# Patient Record
Sex: Male | Born: 1960 | Race: White | Hispanic: No | Marital: Single | Smoking: Former smoker
Health system: Southern US, Community
[De-identification: ages and names within clinical notes are randomized; demographics above are authoritative.]

## PROBLEM LIST (undated history)

## (undated) DIAGNOSIS — G709 Myoneural disorder, unspecified: Secondary | ICD-10-CM

## (undated) DIAGNOSIS — J45909 Unspecified asthma, uncomplicated: Secondary | ICD-10-CM

## (undated) DIAGNOSIS — I429 Cardiomyopathy, unspecified: Secondary | ICD-10-CM

## (undated) DIAGNOSIS — R569 Unspecified convulsions: Secondary | ICD-10-CM

## (undated) DIAGNOSIS — K219 Gastro-esophageal reflux disease without esophagitis: Secondary | ICD-10-CM

## (undated) DIAGNOSIS — G629 Polyneuropathy, unspecified: Secondary | ICD-10-CM

## (undated) HISTORY — PX: KNEE ARTHROSCOPY: SUR90

---

## 2004-02-29 ENCOUNTER — Ambulatory Visit: Payer: Self-pay | Admitting: Internal Medicine

## 2004-03-01 ENCOUNTER — Ambulatory Visit: Payer: Self-pay | Admitting: *Deleted

## 2004-03-20 ENCOUNTER — Ambulatory Visit: Payer: Self-pay | Admitting: Internal Medicine

## 2012-12-01 ENCOUNTER — Encounter (HOSPITAL_COMMUNITY): Admission: EM | Disposition: E | Payer: Self-pay | Source: Home / Self Care

## 2012-12-01 ENCOUNTER — Inpatient Hospital Stay (HOSPITAL_COMMUNITY): Payer: No Typology Code available for payment source

## 2012-12-01 ENCOUNTER — Encounter (HOSPITAL_COMMUNITY): Payer: No Typology Code available for payment source | Admitting: Anesthesiology

## 2012-12-01 ENCOUNTER — Emergency Department (HOSPITAL_COMMUNITY): Payer: No Typology Code available for payment source

## 2012-12-01 ENCOUNTER — Inpatient Hospital Stay (HOSPITAL_COMMUNITY): Payer: No Typology Code available for payment source | Admitting: Anesthesiology

## 2012-12-01 ENCOUNTER — Encounter (HOSPITAL_COMMUNITY): Payer: Self-pay | Admitting: Emergency Medicine

## 2012-12-01 ENCOUNTER — Inpatient Hospital Stay (HOSPITAL_COMMUNITY)
Admission: EM | Admit: 2012-12-01 | Discharge: 2013-01-01 | DRG: 957 | Disposition: E | Payer: No Typology Code available for payment source | Attending: General Surgery | Admitting: General Surgery

## 2012-12-01 DIAGNOSIS — G589 Mononeuropathy, unspecified: Secondary | ICD-10-CM | POA: Diagnosis present

## 2012-12-01 DIAGNOSIS — Z515 Encounter for palliative care: Secondary | ICD-10-CM

## 2012-12-01 DIAGNOSIS — S82209A Unspecified fracture of shaft of unspecified tibia, initial encounter for closed fracture: Secondary | ICD-10-CM

## 2012-12-01 DIAGNOSIS — Z87891 Personal history of nicotine dependence: Secondary | ICD-10-CM

## 2012-12-01 DIAGNOSIS — S0181XA Laceration without foreign body of other part of head, initial encounter: Secondary | ICD-10-CM

## 2012-12-01 DIAGNOSIS — A419 Sepsis, unspecified organism: Secondary | ICD-10-CM | POA: Diagnosis not present

## 2012-12-01 DIAGNOSIS — T79A29A Traumatic compartment syndrome of unspecified lower extremity, initial encounter: Secondary | ICD-10-CM | POA: Diagnosis present

## 2012-12-01 DIAGNOSIS — J45909 Unspecified asthma, uncomplicated: Secondary | ICD-10-CM | POA: Diagnosis present

## 2012-12-01 DIAGNOSIS — I493 Ventricular premature depolarization: Secondary | ICD-10-CM

## 2012-12-01 DIAGNOSIS — S14109A Unspecified injury at unspecified level of cervical spinal cord, initial encounter: Secondary | ICD-10-CM

## 2012-12-01 DIAGNOSIS — G629 Polyneuropathy, unspecified: Secondary | ICD-10-CM

## 2012-12-01 DIAGNOSIS — S82409A Unspecified fracture of shaft of unspecified fibula, initial encounter for closed fracture: Secondary | ICD-10-CM

## 2012-12-01 DIAGNOSIS — IMO0002 Reserved for concepts with insufficient information to code with codable children: Secondary | ICD-10-CM | POA: Diagnosis present

## 2012-12-01 DIAGNOSIS — S14121A Central cord syndrome at C1 level of cervical spinal cord, initial encounter: Principal | ICD-10-CM | POA: Diagnosis present

## 2012-12-01 DIAGNOSIS — D696 Thrombocytopenia, unspecified: Secondary | ICD-10-CM | POA: Diagnosis not present

## 2012-12-01 DIAGNOSIS — S82202A Unspecified fracture of shaft of left tibia, initial encounter for closed fracture: Secondary | ICD-10-CM

## 2012-12-01 DIAGNOSIS — I4949 Other premature depolarization: Secondary | ICD-10-CM | POA: Diagnosis not present

## 2012-12-01 DIAGNOSIS — I429 Cardiomyopathy, unspecified: Secondary | ICD-10-CM

## 2012-12-01 DIAGNOSIS — K920 Hematemesis: Secondary | ICD-10-CM | POA: Diagnosis not present

## 2012-12-01 DIAGNOSIS — T1490XA Injury, unspecified, initial encounter: Secondary | ICD-10-CM | POA: Diagnosis present

## 2012-12-01 DIAGNOSIS — I5181 Takotsubo syndrome: Secondary | ICD-10-CM | POA: Diagnosis not present

## 2012-12-01 DIAGNOSIS — S82142A Displaced bicondylar fracture of left tibia, initial encounter for closed fracture: Secondary | ICD-10-CM

## 2012-12-01 DIAGNOSIS — S82109A Unspecified fracture of upper end of unspecified tibia, initial encounter for closed fracture: Secondary | ICD-10-CM | POA: Diagnosis present

## 2012-12-01 DIAGNOSIS — K219 Gastro-esophageal reflux disease without esophagitis: Secondary | ICD-10-CM | POA: Diagnosis present

## 2012-12-01 DIAGNOSIS — R4182 Altered mental status, unspecified: Secondary | ICD-10-CM | POA: Diagnosis present

## 2012-12-01 DIAGNOSIS — B952 Enterococcus as the cause of diseases classified elsewhere: Secondary | ICD-10-CM | POA: Diagnosis not present

## 2012-12-01 DIAGNOSIS — D62 Acute posthemorrhagic anemia: Secondary | ICD-10-CM | POA: Diagnosis not present

## 2012-12-01 DIAGNOSIS — Z5900 Homelessness unspecified: Secondary | ICD-10-CM

## 2012-12-01 DIAGNOSIS — G839 Paralytic syndrome, unspecified: Secondary | ICD-10-CM

## 2012-12-01 DIAGNOSIS — J151 Pneumonia due to Pseudomonas: Secondary | ICD-10-CM | POA: Diagnosis not present

## 2012-12-01 DIAGNOSIS — N179 Acute kidney failure, unspecified: Secondary | ICD-10-CM | POA: Diagnosis not present

## 2012-12-01 DIAGNOSIS — R197 Diarrhea, unspecified: Secondary | ICD-10-CM | POA: Diagnosis present

## 2012-12-01 DIAGNOSIS — Z66 Do not resuscitate: Secondary | ICD-10-CM | POA: Diagnosis not present

## 2012-12-01 DIAGNOSIS — R259 Unspecified abnormal involuntary movements: Secondary | ICD-10-CM | POA: Diagnosis present

## 2012-12-01 DIAGNOSIS — G609 Hereditary and idiopathic neuropathy, unspecified: Secondary | ICD-10-CM | POA: Diagnosis present

## 2012-12-01 DIAGNOSIS — G959 Disease of spinal cord, unspecified: Secondary | ICD-10-CM | POA: Diagnosis present

## 2012-12-01 DIAGNOSIS — G9341 Metabolic encephalopathy: Secondary | ICD-10-CM | POA: Diagnosis present

## 2012-12-01 DIAGNOSIS — T07XXXA Unspecified multiple injuries, initial encounter: Secondary | ICD-10-CM

## 2012-12-01 DIAGNOSIS — S0081XA Abrasion of other part of head, initial encounter: Secondary | ICD-10-CM

## 2012-12-01 DIAGNOSIS — E871 Hypo-osmolality and hyponatremia: Secondary | ICD-10-CM | POA: Diagnosis not present

## 2012-12-01 DIAGNOSIS — J9600 Acute respiratory failure, unspecified whether with hypoxia or hypercapnia: Secondary | ICD-10-CM | POA: Diagnosis present

## 2012-12-01 DIAGNOSIS — G9340 Encephalopathy, unspecified: Secondary | ICD-10-CM | POA: Diagnosis not present

## 2012-12-01 DIAGNOSIS — N390 Urinary tract infection, site not specified: Secondary | ICD-10-CM | POA: Diagnosis not present

## 2012-12-01 DIAGNOSIS — J9819 Other pulmonary collapse: Secondary | ICD-10-CM | POA: Diagnosis not present

## 2012-12-01 DIAGNOSIS — S0180XA Unspecified open wound of other part of head, initial encounter: Secondary | ICD-10-CM | POA: Diagnosis present

## 2012-12-01 HISTORY — DX: Gastro-esophageal reflux disease without esophagitis: K21.9

## 2012-12-01 HISTORY — DX: Unspecified convulsions: R56.9

## 2012-12-01 HISTORY — DX: Unspecified asthma, uncomplicated: J45.909

## 2012-12-01 HISTORY — DX: Polyneuropathy, unspecified: G62.9

## 2012-12-01 HISTORY — PX: CERVICAL DISC SURGERY: SHX588

## 2012-12-01 HISTORY — DX: Myoneural disorder, unspecified: G70.9

## 2012-12-01 HISTORY — PX: ANTERIOR CERVICAL DECOMP/DISCECTOMY FUSION: SHX1161

## 2012-12-01 HISTORY — DX: Cardiomyopathy, unspecified: I42.9

## 2012-12-01 LAB — CBC
HCT: 46.6 % (ref 39.0–52.0)
Hemoglobin: 16.4 g/dL (ref 13.0–17.0)
MCV: 90.1 fL (ref 78.0–100.0)
RBC: 5.17 MIL/uL (ref 4.22–5.81)
RDW: 13 % (ref 11.5–15.5)
WBC: 7.9 10*3/uL (ref 4.0–10.5)

## 2012-12-01 LAB — CG4 I-STAT (LACTIC ACID): Lactic Acid, Venous: 2.51 mmol/L — ABNORMAL HIGH (ref 0.5–2.2)

## 2012-12-01 LAB — POCT I-STAT, CHEM 8
BUN: 20 mg/dL (ref 6–23)
Calcium, Ion: 1.15 mmol/L (ref 1.12–1.23)
Chloride: 106 mEq/L (ref 96–112)
Glucose, Bld: 109 mg/dL — ABNORMAL HIGH (ref 70–99)
HCT: 48 % (ref 39.0–52.0)
Hemoglobin: 16.3 g/dL (ref 13.0–17.0)
TCO2: 24 mmol/L (ref 0–100)

## 2012-12-01 LAB — COMPREHENSIVE METABOLIC PANEL
AST: 17 U/L (ref 0–37)
CO2: 23 mEq/L (ref 19–32)
Calcium: 8.9 mg/dL (ref 8.4–10.5)
Creatinine, Ser: 1.01 mg/dL (ref 0.50–1.35)
GFR calc Af Amer: >90 mL/min (ref >90)
GFR calc non Af Amer: 84 mL/min — ABNORMAL LOW (ref >90)
Glucose, Bld: 106 mg/dL — ABNORMAL HIGH (ref 70–99)
Sodium: 138 mEq/L (ref 135–145)
Total Protein: 6.6 g/dL (ref 6.0–8.3)

## 2012-12-01 LAB — PROTIME-INR
INR: 1.02 (ref 0.00–1.49)
Prothrombin Time: 13.2 seconds (ref 11.6–15.2)

## 2012-12-01 LAB — ETHANOL: Alcohol, Ethyl (B): <11 mg/dL (ref 0–11)

## 2012-12-01 LAB — TYPE AND SCREEN
ABO/RH(D): A POS
Antibody Screen: NEGATIVE

## 2012-12-01 SURGERY — ANTERIOR CERVICAL DECOMPRESSION/DISCECTOMY FUSION 1 LEVEL
Anesthesia: General | Site: Neck | Wound class: Clean

## 2012-12-01 MED ORDER — EPHEDRINE SULFATE 50 MG/ML IJ SOLN
INTRAMUSCULAR | Status: DC | PRN
  Administered 2012-12-01: 5 mg via INTRAVENOUS

## 2012-12-01 MED ORDER — DIPHENHYDRAMINE HCL 50 MG/ML IJ SOLN: 12.5000 mg | Freq: Four times a day (QID) | INTRAMUSCULAR | Status: DC | PRN

## 2012-12-01 MED ORDER — LACTATED RINGERS IV SOLN
INTRAVENOUS | Status: DC | PRN
  Administered 2012-12-01 (×2): via INTRAVENOUS

## 2012-12-01 MED ORDER — HYDROCODONE-ACETAMINOPHEN 5-325 MG PO TABS: 1.0000 | ORAL_TABLET | ORAL | Status: DC | PRN

## 2012-12-01 MED ORDER — SODIUM CHLORIDE 0.9 % IV SOLN: 250.0000 mL | INTRAVENOUS | Status: DC

## 2012-12-01 MED ORDER — 0.9 % SODIUM CHLORIDE (POUR BTL) OPTIME
TOPICAL | Status: DC | PRN
  Administered 2012-12-01: 1000 mL

## 2012-12-01 MED ORDER — FENTANYL CITRATE 0.05 MG/ML IJ SOLN
INTRAMUSCULAR | Status: DC | PRN
  Administered 2012-12-01 (×6): 50 ug via INTRAVENOUS

## 2012-12-01 MED ORDER — HYDROMORPHONE HCL PF 1 MG/ML IJ SOLN
0.2500 mg | INTRAMUSCULAR | Status: DC | PRN
  Administered 2012-12-01: 0.5 mg via INTRAVENOUS

## 2012-12-01 MED ORDER — SODIUM CHLORIDE 0.9 % IR SOLN
Status: DC | PRN
  Administered 2012-12-01: 18:00:00

## 2012-12-01 MED ORDER — PROMETHAZINE HCL 25 MG/ML IJ SOLN: 6.2500 mg | INTRAMUSCULAR | Status: DC | PRN

## 2012-12-01 MED ORDER — FENTANYL CITRATE 0.05 MG/ML IJ SOLN
INTRAMUSCULAR | Status: AC
  Filled 2012-12-01: qty 2

## 2012-12-01 MED ORDER — SODIUM CHLORIDE 0.9 % IJ SOLN
3.0000 mL | Freq: Two times a day (BID) | INTRAMUSCULAR | Status: DC
  Administered 2012-12-01: 3 mL via INTRAVENOUS

## 2012-12-01 MED ORDER — OXYCODONE-ACETAMINOPHEN 5-325 MG PO TABS: 1.0000 | ORAL_TABLET | ORAL | Status: DC | PRN

## 2012-12-01 MED ORDER — HEMOSTATIC AGENTS (NO CHARGE) OPTIME
TOPICAL | Status: DC | PRN
  Administered 2012-12-01: 1 via TOPICAL

## 2012-12-01 MED ORDER — CYCLOBENZAPRINE HCL 10 MG PO TABS
10.0000 mg | ORAL_TABLET | Freq: Three times a day (TID) | ORAL | Status: DC | PRN
  Administered 2012-12-02 – 2012-12-04 (×4): 10 mg via ORAL
  Filled 2012-12-01 (×5): qty 1

## 2012-12-01 MED ORDER — GLYCOPYRROLATE 0.2 MG/ML IJ SOLN
INTRAMUSCULAR | Status: DC | PRN
  Administered 2012-12-01: 0.6 mg via INTRAVENOUS

## 2012-12-01 MED ORDER — ONDANSETRON HCL 4 MG/2ML IJ SOLN: 4.0000 mg | INTRAMUSCULAR | Status: DC | PRN

## 2012-12-01 MED ORDER — SODIUM CHLORIDE 0.9 % IV SOLN
5.4000 mg/kg/h | INTRAVENOUS | Status: DC
  Filled 2012-12-01: qty 40

## 2012-12-01 MED ORDER — ONDANSETRON HCL 4 MG PO TABS: 4.0000 mg | ORAL_TABLET | Freq: Four times a day (QID) | ORAL | Status: DC | PRN

## 2012-12-01 MED ORDER — DEXTROSE 5 % IV SOLN
3.0000 g | INTRAVENOUS | Status: AC
  Administered 2012-12-01: 3 g via INTRAVENOUS
  Filled 2012-12-01: qty 3000

## 2012-12-01 MED ORDER — LACTATED RINGERS IV SOLN
INTRAVENOUS | Status: DC | PRN
  Administered 2012-12-01: 17:00:00 via INTRAVENOUS

## 2012-12-01 MED ORDER — SENNA 8.6 MG PO TABS
1.0000 | ORAL_TABLET | Freq: Two times a day (BID) | ORAL | Status: DC
  Administered 2012-12-01 – 2012-12-14 (×18): 8.6 mg via ORAL
  Filled 2012-12-01 (×32): qty 1

## 2012-12-01 MED ORDER — SODIUM CHLORIDE 0.9 % IV BOLUS (SEPSIS)
1000.0000 mL | Freq: Once | INTRAVENOUS | Status: AC
  Administered 2012-12-01: 1000 mL via INTRAVENOUS

## 2012-12-01 MED ORDER — INFLUENZA VAC SPLIT QUAD 0.5 ML IM SUSP: 0.5000 mL | INTRAMUSCULAR | Status: DC | PRN

## 2012-12-01 MED ORDER — ACETAMINOPHEN 650 MG RE SUPP
650.0000 mg | RECTAL | Status: DC | PRN
  Filled 2012-12-01: qty 1

## 2012-12-01 MED ORDER — ALUM & MAG HYDROXIDE-SIMETH 200-200-20 MG/5ML PO SUSP: 30.0000 mL | Freq: Four times a day (QID) | ORAL | Status: DC | PRN

## 2012-12-01 MED ORDER — HYDROMORPHONE HCL PF 1 MG/ML IJ SOLN
INTRAMUSCULAR | Status: AC
  Filled 2012-12-01: qty 1

## 2012-12-01 MED ORDER — ROCURONIUM BROMIDE 100 MG/10ML IV SOLN
INTRAVENOUS | Status: DC | PRN
  Administered 2012-12-01: 50 mg via INTRAVENOUS

## 2012-12-01 MED ORDER — CEFAZOLIN SODIUM 1-5 GM-% IV SOLN
1.0000 g | Freq: Three times a day (TID) | INTRAVENOUS | Status: AC
  Administered 2012-12-02 (×2): 1 g via INTRAVENOUS
  Filled 2012-12-01 (×2): qty 50

## 2012-12-01 MED ORDER — HYDROMORPHONE 0.3 MG/ML IV SOLN: INTRAVENOUS | Status: DC

## 2012-12-01 MED ORDER — SODIUM CHLORIDE 0.45 % IV SOLN
INTRAVENOUS | Status: DC
  Administered 2012-12-01: 22:00:00 via INTRAVENOUS

## 2012-12-01 MED ORDER — IOHEXOL 300 MG/ML  SOLN
100.0000 mL | Freq: Once | INTRAMUSCULAR | Status: AC | PRN
  Administered 2012-12-01: 100 mL via INTRAVENOUS

## 2012-12-01 MED ORDER — FAMOTIDINE 20 MG PO TABS
20.0000 mg | ORAL_TABLET | Freq: Every day | ORAL | Status: DC
  Administered 2012-12-01 – 2012-12-04 (×4): 20 mg via ORAL
  Filled 2012-12-01 (×5): qty 1

## 2012-12-01 MED ORDER — SODIUM CHLORIDE 0.9 % IJ SOLN: 9.0000 mL | INTRAMUSCULAR | Status: DC | PRN

## 2012-12-01 MED ORDER — VECURONIUM BROMIDE 10 MG IV SOLR
INTRAVENOUS | Status: DC | PRN
  Administered 2012-12-01: 3 mg via INTRAVENOUS

## 2012-12-01 MED ORDER — BACLOFEN 5 MG HALF TABLET
5.0000 mg | ORAL_TABLET | Freq: Three times a day (TID) | ORAL | Status: DC
  Administered 2012-12-01 – 2012-12-04 (×7): 5 mg via ORAL
  Filled 2012-12-01 (×11): qty 1

## 2012-12-01 MED ORDER — SODIUM CHLORIDE 0.9 % IV SOLN
3000.0000 mg | Freq: Once | INTRAVENOUS | Status: DC
  Filled 2012-12-01: qty 24

## 2012-12-01 MED ORDER — PHENYLEPHRINE HCL 10 MG/ML IJ SOLN
INTRAMUSCULAR | Status: DC | PRN
  Administered 2012-12-01 (×2): 120 ug via INTRAVENOUS
  Administered 2012-12-01: 160 ug via INTRAVENOUS

## 2012-12-01 MED ORDER — PREGABALIN 50 MG PO CAPS
75.0000 mg | ORAL_CAPSULE | Freq: Two times a day (BID) | ORAL | Status: DC
  Administered 2012-12-01 – 2012-12-04 (×7): 75 mg via ORAL
  Filled 2012-12-01 (×5): qty 1
  Filled 2012-12-01: qty 3
  Filled 2012-12-01: qty 1
  Filled 2012-12-01 (×2): qty 3
  Filled 2012-12-01 (×2): qty 1

## 2012-12-01 MED ORDER — TETANUS-DIPHTH-ACELL PERTUSSIS 5-2.5-18.5 LF-MCG/0.5 IM SUSP
0.5000 mL | Freq: Once | INTRAMUSCULAR | Status: AC
  Administered 2012-12-01: 0.5 mL via INTRAMUSCULAR
  Filled 2012-12-01: qty 0.5

## 2012-12-01 MED ORDER — FENTANYL CITRATE 0.05 MG/ML IJ SOLN
50.0000 ug | INTRAMUSCULAR | Status: AC
  Administered 2012-12-01: 50 ug via INTRAVENOUS

## 2012-12-01 MED ORDER — DOCUSATE SODIUM 100 MG PO CAPS
100.0000 mg | ORAL_CAPSULE | Freq: Two times a day (BID) | ORAL | Status: DC
  Administered 2012-12-01 – 2012-12-04 (×7): 100 mg via ORAL
  Filled 2012-12-01 (×7): qty 1

## 2012-12-01 MED ORDER — PHENOL 1.4 % MT LIQD: 1.0000 | OROMUCOSAL | Status: DC | PRN

## 2012-12-01 MED ORDER — MIDAZOLAM HCL 5 MG/5ML IJ SOLN
INTRAMUSCULAR | Status: DC | PRN
  Administered 2012-12-01: 2 mg via INTRAVENOUS

## 2012-12-01 MED ORDER — HYDROMORPHONE HCL PF 1 MG/ML IJ SOLN
0.5000 mg | INTRAMUSCULAR | Status: DC | PRN
  Administered 2012-12-01: 0.5 mg via INTRAVENOUS
  Administered 2012-12-02: 1 mg via INTRAVENOUS
  Administered 2012-12-02 (×3): 0.5 mg via INTRAVENOUS
  Filled 2012-12-01 (×5): qty 1

## 2012-12-01 MED ORDER — THROMBIN 5000 UNITS EX SOLR
CUTANEOUS | Status: DC | PRN
  Administered 2012-12-01 (×2): 5000 [IU] via TOPICAL

## 2012-12-01 MED ORDER — DEXAMETHASONE SODIUM PHOSPHATE 4 MG/ML IJ SOLN
INTRAMUSCULAR | Status: DC | PRN
  Administered 2012-12-01: 12 mg via INTRAVENOUS

## 2012-12-01 MED ORDER — OXYCODONE HCL 5 MG/5ML PO SOLN: 5.0000 mg | Freq: Once | ORAL | Status: DC | PRN

## 2012-12-01 MED ORDER — ACETAMINOPHEN 325 MG PO TABS
650.0000 mg | ORAL_TABLET | ORAL | Status: DC | PRN
  Administered 2012-12-03 – 2012-12-04 (×3): 650 mg via ORAL
  Filled 2012-12-01 (×3): qty 2

## 2012-12-01 MED ORDER — NALOXONE HCL 0.4 MG/ML IJ SOLN: 0.4000 mg | INTRAMUSCULAR | Status: DC | PRN

## 2012-12-01 MED ORDER — MENTHOL 3 MG MT LOZG: 1.0000 | LOZENGE | OROMUCOSAL | Status: DC | PRN

## 2012-12-01 MED ORDER — SUCCINYLCHOLINE CHLORIDE 20 MG/ML IJ SOLN
INTRAMUSCULAR | Status: DC | PRN
  Administered 2012-12-01: 150 mg via INTRAVENOUS

## 2012-12-01 MED ORDER — OXYCODONE HCL 5 MG PO TABS: 5.0000 mg | ORAL_TABLET | Freq: Once | ORAL | Status: DC | PRN

## 2012-12-01 MED ORDER — SODIUM CHLORIDE 0.9 % IJ SOLN: 3.0000 mL | INTRAMUSCULAR | Status: DC | PRN

## 2012-12-01 MED ORDER — FENTANYL CITRATE 0.05 MG/ML IJ SOLN
100.0000 ug | INTRAMUSCULAR | Status: DC | PRN
  Administered 2012-12-01 (×3): 100 ug via INTRAVENOUS
  Filled 2012-12-01 (×3): qty 2

## 2012-12-01 MED ORDER — ONDANSETRON HCL 4 MG/2ML IJ SOLN
4.0000 mg | Freq: Four times a day (QID) | INTRAMUSCULAR | Status: DC | PRN
  Administered 2012-12-05: 4 mg via INTRAVENOUS
  Filled 2012-12-01: qty 2

## 2012-12-01 MED ORDER — NEOSTIGMINE METHYLSULFATE 1 MG/ML IJ SOLN
INTRAMUSCULAR | Status: DC | PRN
  Administered 2012-12-01: 5 mg via INTRAVENOUS

## 2012-12-01 MED ORDER — ONDANSETRON HCL 4 MG/2ML IJ SOLN
INTRAMUSCULAR | Status: DC | PRN
  Administered 2012-12-01: 4 mg via INTRAVENOUS

## 2012-12-01 MED ORDER — FENTANYL CITRATE 0.05 MG/ML IJ SOLN
50.0000 ug | Freq: Once | INTRAMUSCULAR | Status: AC
  Administered 2012-12-01: 50 ug via INTRAVENOUS

## 2012-12-01 MED ORDER — LIDOCAINE HCL (CARDIAC) 20 MG/ML IV SOLN
INTRAVENOUS | Status: DC | PRN
  Administered 2012-12-01: 100 mg via INTRAVENOUS

## 2012-12-01 MED ORDER — DIPHENHYDRAMINE HCL 12.5 MG/5ML PO ELIX
12.5000 mg | ORAL_SOLUTION | Freq: Four times a day (QID) | ORAL | Status: DC | PRN
  Filled 2012-12-01: qty 5

## 2012-12-01 SURGICAL SUPPLY — 56 items
APL SKNCLS STERI-STRIP NONHPOA (GAUZE/BANDAGES/DRESSINGS) ×2
BAG DECANTER FOR FLEXI CONT (MISCELLANEOUS) ×2 IMPLANT
BENZOIN TINCTURE PRP APPL 2/3 (GAUZE/BANDAGES/DRESSINGS) ×3 IMPLANT
BRUSH SCRUB EZ PLAIN DRY (MISCELLANEOUS) ×2 IMPLANT
BUR MATCHSTICK NEURO 3.0 LAGG (BURR) ×2 IMPLANT
CANISTER SUCT 3000ML (MISCELLANEOUS) ×2 IMPLANT
CONT SPEC 4OZ CLIKSEAL STRL BL (MISCELLANEOUS) ×2 IMPLANT
DRAPE C-ARM 42X72 X-RAY (DRAPES) ×4 IMPLANT
DRAPE LAPAROTOMY 100X72 PEDS (DRAPES) ×2 IMPLANT
DRAPE MICROSCOPE LEICA (MISCELLANEOUS) ×1 IMPLANT
DRAPE MICROSCOPE ZEISS OPMI (DRAPES) ×1 IMPLANT
DRAPE POUCH INSTRU U-SHP 10X18 (DRAPES) ×2 IMPLANT
DRILL BIT (BIT) ×1 IMPLANT
DURAPREP 6ML APPLICATOR 50/CS (WOUND CARE) ×2 IMPLANT
ELECT COATED BLADE 2.86 ST (ELECTRODE) ×2 IMPLANT
ELECT REM PT RETURN 9FT ADLT (ELECTROSURGICAL) ×2
ELECTRODE REM PT RTRN 9FT ADLT (ELECTROSURGICAL) ×1 IMPLANT
GAUZE SPONGE 4X4 16PLY XRAY LF (GAUZE/BANDAGES/DRESSINGS) IMPLANT
GLOVE BIO SURGEON STRL SZ 6.5 (GLOVE) ×3 IMPLANT
GLOVE BIO SURGEON STRL SZ8 (GLOVE) ×1 IMPLANT
GLOVE BIOGEL PI IND STRL 6.5 (GLOVE) IMPLANT
GLOVE BIOGEL PI INDICATOR 6.5 (GLOVE) ×3
GLOVE ECLIPSE 8.5 STRL (GLOVE) ×3 IMPLANT
GLOVE EXAM NITRILE LRG STRL (GLOVE) IMPLANT
GLOVE EXAM NITRILE MD LF STRL (GLOVE) ×1 IMPLANT
GLOVE EXAM NITRILE XL STR (GLOVE) IMPLANT
GLOVE EXAM NITRILE XS STR PU (GLOVE) IMPLANT
GLOVE INDICATOR 8.5 STRL (GLOVE) ×1 IMPLANT
GLOVE SURG SS PI 6.5 STRL IVOR (GLOVE) ×1 IMPLANT
GOWN BRE IMP SLV AUR LG STRL (GOWN DISPOSABLE) ×2 IMPLANT
GOWN BRE IMP SLV AUR XL STRL (GOWN DISPOSABLE) ×2 IMPLANT
GOWN STRL REIN 2XL LVL4 (GOWN DISPOSABLE) ×1 IMPLANT
HEAD HALTER (SOFTGOODS) ×2 IMPLANT
KIT BASIN OR (CUSTOM PROCEDURE TRAY) ×2 IMPLANT
KIT ROOM TURNOVER OR (KITS) ×2 IMPLANT
NDL SPNL 20GX3.5 QUINCKE YW (NEEDLE) ×1 IMPLANT
NEEDLE SPNL 20GX3.5 QUINCKE YW (NEEDLE) ×2 IMPLANT
NS IRRIG 1000ML POUR BTL (IV SOLUTION) ×2 IMPLANT
PACK LAMINECTOMY NEURO (CUSTOM PROCEDURE TRAY) ×2 IMPLANT
PAD ARMBOARD 7.5X6 YLW CONV (MISCELLANEOUS) ×6 IMPLANT
PLATE ELITE 30MM (Plate) ×1 IMPLANT
RUBBERBAND STERILE (MISCELLANEOUS) ×4 IMPLANT
SCREW ST 13X4XST VA NS SPNE (Screw) IMPLANT
SCREW ST VAR 4 ATL (Screw) ×8 IMPLANT
SPONGE GAUZE 4X4 12PLY (GAUZE/BANDAGES/DRESSINGS) ×2 IMPLANT
SPONGE INTESTINAL PEANUT (DISPOSABLE) ×2 IMPLANT
SPONGE SURGIFOAM ABS GEL SZ50 (HEMOSTASIS) ×2 IMPLANT
STRIP CLOSURE SKIN 1/2X4 (GAUZE/BANDAGES/DRESSINGS) ×2 IMPLANT
SUT PDS AB 5-0 P3 18 (SUTURE) ×2 IMPLANT
SUT VIC AB 3-0 SH 8-18 (SUTURE) ×2 IMPLANT
SYR 20ML ECCENTRIC (SYRINGE) ×2 IMPLANT
TAPE CLOTH 4X10 WHT NS (GAUZE/BANDAGES/DRESSINGS) ×2 IMPLANT
TOWEL OR 17X24 6PK STRL BLUE (TOWEL DISPOSABLE) ×2 IMPLANT
TOWEL OR 17X26 10 PK STRL BLUE (TOWEL DISPOSABLE) ×2 IMPLANT
TRAP SPECIMEN MUCOUS 40CC (MISCELLANEOUS) ×2 IMPLANT
WATER STERILE IRR 1000ML POUR (IV SOLUTION) ×2 IMPLANT
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
## 2012-12-01 NOTE — ED Notes (Signed)
Pt transported to CT with this RN 

## 2012-12-01 NOTE — ED Notes (Signed)
From CT to xray with this RN

## 2012-12-01 NOTE — Anesthesia Procedure Notes (Signed)
Procedure Name: Intubation Date/Time: 12/11/2012 5:45 PM Performed by: Whitman Hero Pre-anesthesia Checklist: Patient identified, Emergency Drugs available, Suction available and Patient being monitored Patient Re-evaluated:Patient Re-evaluated prior to inductionOxygen Delivery Method: Circle system utilized Preoxygenation: Pre-oxygenation with 100% oxygen Intubation Type: IV induction Ventilation: Mask ventilation without difficulty Laryngoscope Size: Mac and 3 Grade View: Grade I Tube type: Oral Number of attempts: 1 Airway Equipment and Method: Stylet and Video-laryngoscopy Placement Confirmation: ETT inserted through vocal cords under direct vision,  breath sounds checked- equal and bilateral and positive ETCO2 Secured at: 22 cm Tube secured with: Tape Dental Injury: Teeth and Oropharynx as per pre-operative assessment  Difficulty Due To: Difficult Airway-  due to neck instability and Difficulty was anticipated Comments: Elective use of glidescope. Neck collar taken off for induction and replaced for transfer to surgical table. Modified RSI used.

## 2012-12-01 NOTE — ED Notes (Signed)
[employer] at the bedside to speak with patient.
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
## 2012-12-01 NOTE — ED Notes (Signed)
Pt transported to MRI 

## 2012-12-01 NOTE — ED Notes (Signed)
Given 100 mcg of Fentanyl in MRI.
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
## 2012-12-01 NOTE — Brief Op Note (Signed)
12/18/2012  7:57 PM  PATIENT:  Johnny Mathis  52 y.o. male  PRE-OPERATIVE DIAGNOSIS:  c4/5 stenosis  POST-OPERATIVE DIAGNOSIS:  c4/5 stenosis  PROCEDURE:  Procedure(s): ANTERIOR CERVICAL FOUR-FIVE DECOMPRESSION/DISCECTOMY FUSION 1 LEVEL (N/A)  SURGEON:  Surgeon(s) and Role:    * Temple Pacini, MD - Primary    * Mariam Dollar, MD - Assisting  PHYSICIAN ASSISTANT:   ASSISTANTS:    ANESTHESIA:   general  EBL:  Total I/O In: 500 [I.V.:500] Out: 275 [Urine:75; Blood:200]  BLOOD ADMINISTERED:none  DRAINS: none   LOCAL MEDICATIONS USED:  NONE  SPECIMEN:  No Specimen  DISPOSITION OF SPECIMEN:  N/A  COUNTS:  YES  TOURNIQUET:  * No tourniquets in log *  DICTATION: .Dragon Dictation  PLAN OF CARE: Admit to inpatient   PATIENT DISPOSITION:  PACU - hemodynamically stable.   Delay start of Pharmacological VTE agent (>24hrs) due to surgical blood loss or risk of bleeding: yes

## 2012-12-01 NOTE — ED Notes (Signed)
Pt brought back to ED with this RN.

## 2012-12-01 NOTE — Progress Notes (Signed)
Orthopedic Tech Progress Note Patient Details:  Johnny Mathis [birth year] [id number] Knee immobilizer applied as ordered to Left LE. Patient to have x-rays and CT Scan and Ortho Tech called back if needed.   Ortho Devices Type of Ortho Device: Knee Immobilizer Ortho Device/Splint Location: Left LE Ortho Device/Splint Interventions: Application   Asia R Thompson December 18, 2012, 9:05 AM
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
## 2012-12-01 NOTE — ED Provider Notes (Signed)
I was called by radiology concerning MR imaging I spoke to dr pool who will see patient He has reviewed imaging Pt can move all extremities, though he appears to have weakness in his wrist flex/extension Pt to be admitted to ICU  Joya Gaskins, MD 12/04/12 1437

## 2012-12-01 NOTE — Consult Note (Signed)
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 dysphagia, dysphonia, and non-benefit. The patient has been given the opportunity ask questions. He recognizes the very high likelihood that a spinal cord injury could worsen with surgery. He agrees to proceed. Kataleena Holsapple A 12/30/2012, 2:58 PM

## 2012-12-01 NOTE — ED Notes (Signed)
Istat lactic acid shown to Dr. Bebe Shaggy

## 2012-12-01 NOTE — Transfer of Care (Signed)
Immediate Anesthesia Transfer of Care Note  Patient: Johnny Mathis  Procedure(s) Performed: Procedure(s): ANTERIOR CERVICAL FOUR-FIVE DECOMPRESSION/DISCECTOMY FUSION 1 LEVEL (N/A)  Patient Location: PACU  Anesthesia Type:General  Level of Consciousness: awake, alert  and oriented  Airway & Oxygen Therapy: Patient Spontanous Breathing and Patient connected to nasal cannula oxygen  Post-op Assessment: Report given to PACU RN and Post -op Vital signs reviewed and stable  Post vital signs: Reviewed and stable  Complications: No apparent anesthesia complications
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
## 2012-12-01 NOTE — ED Notes (Signed)
Paged Dr. Jordan Likes to [phone]

## 2012-12-01 NOTE — Plan of Care (Signed)
Problem: Consults Goal: Diagnosis - Spinal Surgery Cervical Spine Fusion     

## 2012-12-01 NOTE — Anesthesia Postprocedure Evaluation (Signed)
Anesthesia Post Note  Patient: Johnny Mathis  Procedure(s) Performed: Procedure(s) (LRB): ANTERIOR CERVICAL FOUR-FIVE DECOMPRESSION/DISCECTOMY FUSION 1 LEVEL (N/A)  Anesthesia type: general  Patient location: PACU  Post pain: Pain level controlled  Post assessment: Patient's Cardiovascular Status Stable  Post vital signs: Reviewed and stable  Level of consciousness: sedated  Complications: No apparent anesthesia complications

## 2012-12-01 NOTE — ED Provider Notes (Signed)
LACERATION REPAIR Performed by: Junious Silk Authorized by: Junious Silk Consent: Verbal consent obtained. Risks and benefits: risks, benefits and alternatives were discussed Consent given by: patient Patient identity confirmed: provided demographic data Prepped and Draped in normal sterile fashion Wound explored  Laceration Location: right forehead  Laceration Length: 2 cm  No Foreign Bodies seen or palpated  Anesthesia: local infiltration  Local anesthetic: lidocaine 2% 1% epinephrine  Anesthetic total: 3 ml  Irrigation method: syringe Amount of cleaning: standard  Skin closure: 4-0 Prolene  Number of sutures: 3  Technique: simple interrupted   Patient tolerance: Patient tolerated the procedure well with no immediate complications.   Mora Bellman, PA-C 12/02/2012 1208

## 2012-12-01 NOTE — ED Notes (Signed)
Hannah, PA at the bedside.  

## 2012-12-01 NOTE — H&P (Signed)
Would not start steroids until neurosurgery has seen the patient.  Will go ahead and get MRI.  Patient has history of lower extremity neuropathy.  This patient has been seen and I agree with the findings and treatment plan.  Marta Lamas. Gae Bon, MD, FACS [phone] (pager) [phone] (direct pager) Trauma Surgeon
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
## 2012-12-01 NOTE — ED Provider Notes (Signed)
Medical screening examination/treatment/procedure(s) were conducted as a shared visit with non-physician practitioner(s) and myself.  I personally evaluated the patient during the encounter.  EKG Interpretation    Date/Time:    Ventricular Rate:    PR Interval:    QRS Duration:   QT Interval:    QTC Calculation:   R Axis:     Text Interpretation:                Joya Gaskins, MD 12/04/2012 [phone]

## 2012-12-02 MED ORDER — CEFAZOLIN SODIUM-DEXTROSE 2-3 GM-% IV SOLR
2.0000 g | Freq: Once | INTRAVENOUS | Status: AC
  Administered 2012-12-02: 2 g via INTRAVENOUS
  Filled 2012-12-02: qty 50

## 2012-12-02 MED ORDER — BACITRACIN ZINC 500 UNIT/GM EX OINT
TOPICAL_OINTMENT | Freq: Two times a day (BID) | CUTANEOUS | Status: DC
  Administered 2012-12-02 – 2012-12-03 (×4): via TOPICAL
  Administered 2012-12-04 (×2): 15.5556 via TOPICAL
  Administered 2012-12-05 (×2): via TOPICAL
  Administered 2012-12-06: 15.5556 via TOPICAL
  Administered 2012-12-06 – 2012-12-07 (×2): via TOPICAL
  Administered 2012-12-07: 15.5556 via TOPICAL
  Administered 2012-12-08 – 2012-12-10 (×6): via TOPICAL
  Administered 2012-12-11: 15.5556 via TOPICAL
  Administered 2012-12-11 – 2012-12-14 (×6): via TOPICAL
  Administered 2012-12-14: 15.5556 via TOPICAL
  Administered 2012-12-15: 22:00:00 via TOPICAL
  Administered 2012-12-15 – 2012-12-16 (×2): 15.5556 via TOPICAL
  Filled 2012-12-02: qty 28.35
  Filled 2012-12-02: qty 15
  Filled 2012-12-02: qty 28.35

## 2012-12-02 MED ORDER — OXYCODONE HCL 5 MG PO TABS
10.0000 mg | ORAL_TABLET | ORAL | Status: DC | PRN
  Administered 2012-12-02 – 2012-12-03 (×3): 20 mg via ORAL
  Administered 2012-12-03: 10 mg via ORAL
  Filled 2012-12-02 (×3): qty 4
  Filled 2012-12-02: qty 2

## 2012-12-02 MED ORDER — HYDROMORPHONE HCL PF 1 MG/ML IJ SOLN
1.0000 mg | INTRAMUSCULAR | Status: DC | PRN
  Administered 2012-12-02 – 2012-12-04 (×2): 1 mg via INTRAVENOUS
  Filled 2012-12-02 (×2): qty 1

## 2012-12-02 MED ORDER — ENOXAPARIN SODIUM 30 MG/0.3ML ~~LOC~~ SOLN
30.0000 mg | Freq: Two times a day (BID) | SUBCUTANEOUS | Status: DC
  Filled 2012-12-02 (×2): qty 0.3

## 2012-12-02 MED ORDER — HYDROMORPHONE HCL PF 1 MG/ML IJ SOLN: 0.5000 mg | INTRAMUSCULAR | Status: DC | PRN

## 2012-12-02 MED ORDER — TRAMADOL HCL 50 MG PO TABS
100.0000 mg | ORAL_TABLET | Freq: Four times a day (QID) | ORAL | Status: DC
  Administered 2012-12-02 – 2012-12-04 (×9): 100 mg via ORAL
  Filled 2012-12-02 (×9): qty 2

## 2012-12-02 MED ORDER — FENTANYL CITRATE 0.05 MG/ML IJ SOLN: 50.0000 ug | Freq: Once | INTRAMUSCULAR | Status: DC

## 2012-12-02 MED ORDER — MIDAZOLAM HCL 2 MG/2ML IJ SOLN: 1.0000 mg | INTRAMUSCULAR | Status: DC | PRN
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
## 2012-12-02 NOTE — Progress Notes (Signed)
PT Cancellation Note  Patient Details Name: Johnny Mathis MRN: [id number] DOB: 09-16-1960   Cancelled Treatment:    Reason Eval/Treat Not Completed: Patient not medically ready. RN deferred due to pt con't to be on strict bedrest due to awaiting thoracic spine clearance. Pt also planned for surgery tomorrow for L LE. PT to return when appropriate.   Marcene Brawn 12/02/2012, 10:24 AM
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
## 2012-12-02 NOTE — Progress Notes (Signed)
Postop day 1. No evidence overnight. Pain well controlled. No new numbness or weakness.  Afebrile. Vitals are stable. Awake and alert. Patient's wound healing well. Next soft. Airway midline. Motor function stable. Deltoids, biceps, wrist extensors 4/5 bilaterally. Absent triceps and grips and intrinsics bilaterally. For were 5 bilateral lower extremities area and  Status post severe central cord injury. Doing well following C4-5 anterior cervical decompression and fusion. Patient okay to be mobilized from my standpoint. Continue collar. Possible delayed posterior cervical decompression in 2 weeks.

## 2012-12-02 NOTE — Progress Notes (Signed)
I have seen and examined the pt and agree with PA-Jeffery's progress note. Ortho to OR tomorrow PM for tib/fib fx fixation PT when OK with NSR
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
## 2012-12-02 NOTE — Progress Notes (Signed)
OT Cancellation Note  Patient Details Name: Johnny Mathis MRN: [id number] DOB: August 01, 1960   Cancelled Treatment:    Reason Eval/Treat Not Completed: Medical issues which prohibited therapy (bedrest)  [facility] Kayler Rise, OTR/L  [phone] 12/02/2012 12/02/2012, 12:15 PM

## 2012-12-03 ENCOUNTER — Inpatient Hospital Stay (HOSPITAL_COMMUNITY): Payer: No Typology Code available for payment source | Admitting: Certified Registered Nurse Anesthetist

## 2012-12-03 ENCOUNTER — Encounter (HOSPITAL_COMMUNITY): Admission: EM | Disposition: E | Payer: Self-pay | Source: Home / Self Care

## 2012-12-03 ENCOUNTER — Encounter (HOSPITAL_COMMUNITY): Payer: No Typology Code available for payment source | Admitting: Certified Registered Nurse Anesthetist

## 2012-12-03 ENCOUNTER — Inpatient Hospital Stay (HOSPITAL_COMMUNITY): Payer: No Typology Code available for payment source

## 2012-12-03 ENCOUNTER — Encounter (HOSPITAL_COMMUNITY): Payer: Self-pay | Admitting: Certified Registered Nurse Anesthetist

## 2012-12-03 DIAGNOSIS — S14121A Central cord syndrome at C1 level of cervical spinal cord, initial encounter: Secondary | ICD-10-CM

## 2012-12-03 DIAGNOSIS — R509 Fever, unspecified: Secondary | ICD-10-CM

## 2012-12-03 HISTORY — PX: ORIF TIBIA PLATEAU: SHX2132

## 2012-12-03 LAB — URINALYSIS, ROUTINE W REFLEX MICROSCOPIC
Glucose, UA: NEGATIVE mg/dL
Protein, ur: 30 mg/dL — AB
Urobilinogen, UA: 0.2 mg/dL (ref 0.0–1.0)

## 2012-12-03 LAB — URINE MICROSCOPIC-ADD ON

## 2012-12-03 SURGERY — OPEN REDUCTION INTERNAL FIXATION (ORIF) TIBIAL PLATEAU
Anesthesia: General | Site: Leg Lower | Laterality: Left

## 2012-12-03 MED ORDER — ROCURONIUM BROMIDE 100 MG/10ML IV SOLN
INTRAVENOUS | Status: DC | PRN
  Administered 2012-12-03: 70 mg via INTRAVENOUS

## 2012-12-03 MED ORDER — MIDAZOLAM HCL 5 MG/5ML IJ SOLN
INTRAMUSCULAR | Status: DC | PRN
  Administered 2012-12-03: 2 mg via INTRAVENOUS

## 2012-12-03 MED ORDER — CIPROFLOXACIN HCL 500 MG PO TABS
500.0000 mg | ORAL_TABLET | Freq: Two times a day (BID) | ORAL | Status: DC
  Administered 2012-12-03 – 2012-12-04 (×3): 500 mg via ORAL
  Filled 2012-12-03 (×7): qty 1

## 2012-12-03 MED ORDER — LACTATED RINGERS IV SOLN
INTRAVENOUS | Status: DC | PRN
  Administered 2012-12-03: 16:00:00 via INTRAVENOUS

## 2012-12-03 MED ORDER — FENTANYL CITRATE 0.05 MG/ML IJ SOLN
INTRAMUSCULAR | Status: DC | PRN
  Administered 2012-12-03: 50 ug via INTRAVENOUS

## 2012-12-03 MED ORDER — LACTATED RINGERS IV SOLN
INTRAVENOUS | Status: DC
  Administered 2012-12-04: 14:00:00 via INTRAVENOUS

## 2012-12-03 MED ORDER — ONDANSETRON HCL 4 MG/2ML IJ SOLN
INTRAMUSCULAR | Status: DC | PRN
  Administered 2012-12-03: 4 mg via INTRAVENOUS

## 2012-12-03 MED ORDER — CEFAZOLIN SODIUM-DEXTROSE 2-3 GM-% IV SOLR
2.0000 g | Freq: Once | INTRAVENOUS | Status: AC
  Administered 2012-12-03: 2 g via INTRAVENOUS

## 2012-12-03 MED ORDER — ONDANSETRON HCL 4 MG/2ML IJ SOLN: 4.0000 mg | Freq: Once | INTRAMUSCULAR | Status: DC | PRN

## 2012-12-03 MED ORDER — CEFAZOLIN SODIUM-DEXTROSE 2-3 GM-% IV SOLR
INTRAVENOUS | Status: AC
  Filled 2012-12-03: qty 50

## 2012-12-03 MED ORDER — 0.9 % SODIUM CHLORIDE (POUR BTL) OPTIME
TOPICAL | Status: DC | PRN
  Administered 2012-12-03: 1000 mL

## 2012-12-03 MED ORDER — GLYCOPYRROLATE 0.2 MG/ML IJ SOLN
INTRAMUSCULAR | Status: DC | PRN
  Administered 2012-12-03: .6 mg via INTRAVENOUS

## 2012-12-03 MED ORDER — NEOSTIGMINE METHYLSULFATE 1 MG/ML IJ SOLN
INTRAMUSCULAR | Status: DC | PRN
  Administered 2012-12-03: 5 mg via INTRAVENOUS

## 2012-12-03 MED ORDER — ARTIFICIAL TEARS OP OINT
TOPICAL_OINTMENT | OPHTHALMIC | Status: DC | PRN
  Administered 2012-12-03: 1 via OPHTHALMIC

## 2012-12-03 MED ORDER — HYDROMORPHONE HCL PF 1 MG/ML IJ SOLN: 0.2500 mg | INTRAMUSCULAR | Status: DC | PRN

## 2012-12-03 MED ORDER — LIDOCAINE HCL (CARDIAC) 20 MG/ML IV SOLN
INTRAVENOUS | Status: DC | PRN
  Administered 2012-12-03: 80 mg via INTRAVENOUS

## 2012-12-03 MED ORDER — LACTATED RINGERS IV SOLN
INTRAVENOUS | Status: DC
  Administered 2012-12-03: 15:00:00 via INTRAVENOUS

## 2012-12-03 MED ORDER — PROPOFOL 10 MG/ML IV BOLUS
INTRAVENOUS | Status: DC | PRN
  Administered 2012-12-03: 130 mg via INTRAVENOUS

## 2012-12-03 SURGICAL SUPPLY — 86 items
BANDAGE ELASTIC 4 VELCRO ST LF (GAUZE/BANDAGES/DRESSINGS) ×1 IMPLANT
BANDAGE ELASTIC 6 VELCRO ST LF (GAUZE/BANDAGES/DRESSINGS) ×2 IMPLANT
BANDAGE GAUZE ELAST BULKY 4 IN (GAUZE/BANDAGES/DRESSINGS) IMPLANT
BIT DRILL 100X2.5XANTM LCK (BIT) IMPLANT
BIT DRILL CAL (BIT) IMPLANT
BIT DRL 100X2.5XANTM LCK (BIT) ×1
BLADE SURG 10 STRL SS (BLADE) ×2 IMPLANT
BLADE SURG ROTATE 9660 (MISCELLANEOUS) ×1 IMPLANT
CAST PADDING SYN 3 (CAST SUPPLIES) IMPLANT
CLEANER TIP ELECTROSURG 2X2 (MISCELLANEOUS) ×1 IMPLANT
CLOTH BEACON ORANGE TIMEOUT ST (SAFETY) ×2 IMPLANT
COVER MAYO STAND STRL (DRAPES) ×2 IMPLANT
COVER SURGICAL LIGHT HANDLE (MISCELLANEOUS) ×2 IMPLANT
CUFF TOURNIQUET SINGLE 34IN LL (TOURNIQUET CUFF) ×1 IMPLANT
CUFF TOURNIQUET SINGLE 44IN (TOURNIQUET CUFF) IMPLANT
DRAPE C-ARM 42X72 X-RAY (DRAPES) ×1 IMPLANT
DRAPE C-ARMOR (DRAPES) ×1 IMPLANT
DRAPE INCISE IOBAN 66X45 STRL (DRAPES) ×2 IMPLANT
DRAPE U-SHAPE 47X51 STRL (DRAPES) ×2 IMPLANT
DRILL BIT 2.5MM (BIT) ×2
DRILL BIT CAL (BIT) ×2
DRSG ADAPTIC 3X8 NADH LF (GAUZE/BANDAGES/DRESSINGS) ×1 IMPLANT
DRSG PAD ABDOMINAL 8X10 ST (GAUZE/BANDAGES/DRESSINGS) ×3 IMPLANT
DURAPREP 26ML APPLICATOR (WOUND CARE) ×2 IMPLANT
ELECT REM PT RETURN 9FT ADLT (ELECTROSURGICAL) ×2
ELECTRODE REM PT RTRN 9FT ADLT (ELECTROSURGICAL) ×1 IMPLANT
EVACUATOR 1/8 PVC DRAIN (DRAIN) ×1 IMPLANT
GLOVE BIO SURGEON STRL SZ7 (GLOVE) ×1 IMPLANT
GLOVE BIO SURGEON STRL SZ8 (GLOVE) ×1 IMPLANT
GLOVE BIOGEL PI IND STRL 7.5 (GLOVE) ×1 IMPLANT
GLOVE BIOGEL PI IND STRL 8 (GLOVE) ×1 IMPLANT
GLOVE BIOGEL PI INDICATOR 7.5 (GLOVE) ×1
GLOVE BIOGEL PI INDICATOR 8 (GLOVE) ×1
GLOVE BIOGEL PI ORTHO PRO SZ7 (GLOVE) ×1
GLOVE ECLIPSE 7.0 STRL STRAW (GLOVE) ×2 IMPLANT
GLOVE ORTHO TXT STRL SZ7.5 (GLOVE) ×2 IMPLANT
GLOVE PI ORTHO PRO STRL SZ7 (GLOVE) IMPLANT
GLOVE SURG SS PI 7.0 STRL IVOR (GLOVE) ×1 IMPLANT
GOWN PREVENTION PLUS LG XLONG (DISPOSABLE) ×1 IMPLANT
GOWN PREVENTION PLUS XLARGE (GOWN DISPOSABLE) ×2 IMPLANT
GOWN STRL NON-REIN LRG LVL3 (GOWN DISPOSABLE) ×2 IMPLANT
GOWN STRL REIN XL XLG (GOWN DISPOSABLE) ×1 IMPLANT
K-WIRE ACE 1.6X6 (WIRE) ×2
KIT BASIN OR (CUSTOM PROCEDURE TRAY) ×2 IMPLANT
KIT ROOM TURNOVER OR (KITS) ×2 IMPLANT
KWIRE ACE 1.6X6 (WIRE) IMPLANT
MANIFOLD NEPTUNE II (INSTRUMENTS) ×1 IMPLANT
NDL HYPO 25X1 1.5 SAFETY (NEEDLE) ×1 IMPLANT
NEEDLE HYPO 25X1 1.5 SAFETY (NEEDLE) IMPLANT
NS IRRIG 1000ML POUR BTL (IV SOLUTION) ×2 IMPLANT
PACK ORTHO EXTREMITY (CUSTOM PROCEDURE TRAY) ×2 IMPLANT
PAD ARMBOARD 7.5X6 YLW CONV (MISCELLANEOUS) ×4 IMPLANT
PAD CAST 4YDX4 CTTN HI CHSV (CAST SUPPLIES) IMPLANT
PADDING CAST COTTON 4X4 STRL (CAST SUPPLIES) ×6
PADDING CAST COTTON 6X4 STRL (CAST SUPPLIES) ×4 IMPLANT
PLATE LOCK 9H STD LT PROX TIB (Plate) ×1 IMPLANT
SCREW CORTICAL 3.5MM 38MM (Screw) ×2 IMPLANT
SCREW CORTICAL 3.5MM 48MM (Screw) ×2 IMPLANT
SCREW LOCK CORT STAR 3.5X36 (Screw) ×2 IMPLANT
SCREW LOCK CORT STAR 3.5X40 (Screw) ×1 IMPLANT
SCREW LOCK CORT STAR 3.5X60 (Screw) ×2 IMPLANT
SCREW LOCK CORT STAR 3.5X65 (Screw) ×1 IMPLANT
SCREW LOCK CORT STAR 3.5X75 (Screw) ×3 IMPLANT
SCREW LOCK CORT STAR 3.5X80 (Screw) ×1 IMPLANT
SCREW LP 3.5X70MM (Screw) ×1 IMPLANT
SCREW LP 3.5X75MM (Screw) ×1 IMPLANT
SPLINT PLASTER CAST XFAST 5X30 (CAST SUPPLIES) IMPLANT
SPLINT PLASTER XFAST SET 5X30 (CAST SUPPLIES) ×1
SPONGE GAUZE 4X4 12PLY (GAUZE/BANDAGES/DRESSINGS) ×1 IMPLANT
SPONGE LAP 18X18 X RAY DECT (DISPOSABLE) ×2 IMPLANT
STAPLER VISISTAT 35W (STAPLE) IMPLANT
STOCKINETTE IMPERVIOUS LG (DRAPES) ×2 IMPLANT
SUCTION FRAZIER TIP 10 FR DISP (SUCTIONS) ×2 IMPLANT
SUT ETHIBOND 2 0 V5 (SUTURE) ×1 IMPLANT
SUT VIC AB 0 CT1 27 (SUTURE) ×2
SUT VIC AB 0 CT1 27XBRD ANBCTR (SUTURE) ×1 IMPLANT
SUT VIC AB 1 CT1 27 (SUTURE) ×2
SUT VIC AB 1 CT1 27XBRD ANBCTR (SUTURE) ×1 IMPLANT
SUT VIC AB 2-0 CT1 27 (SUTURE) ×2
SUT VIC AB 2-0 CT1 TAPERPNT 27 (SUTURE) IMPLANT
SYR CONTROL 10ML LL (SYRINGE) ×1 IMPLANT
TOWEL OR 17X24 6PK STRL BLUE (TOWEL DISPOSABLE) ×2 IMPLANT
TOWEL OR 17X26 10 PK STRL BLUE (TOWEL DISPOSABLE) ×2 IMPLANT
TUBE CONNECTING 12X1/4 (SUCTIONS) ×2 IMPLANT
WATER STERILE IRR 1000ML POUR (IV SOLUTION) ×2 IMPLANT
YANKAUER SUCT BULB TIP NO VENT (SUCTIONS) ×2 IMPLANT

## 2012-12-03 NOTE — Anesthesia Postprocedure Evaluation (Signed)
  Anesthesia Post-op Note  Patient: Johnny Mathis  Procedure(s) Performed: Procedure(s): OPEN REDUCTION INTERNAL FIXATION (ORIF) TIBIAL PLATEAU- left (Left)  Patient Location: PACU  Anesthesia Type:General  Level of Consciousness: awake  Airway and Oxygen Therapy: Patient Spontanous Breathing  Post-op Pain: mild  Post-op Assessment: Post-op Vital signs reviewed, Patient's Cardiovascular Status Stable, Respiratory Function Stable, Patent Airway, No signs of Nausea or vomiting and Pain level controlled  Post-op Vital Signs: Reviewed and stable  Complications: No apparent anesthesia complications
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
## 2012-12-03 NOTE — Progress Notes (Signed)
Physical medicine and rehabilitation consult received. Will followup after left tibia-fibula fracture repair and a formal physical and occupational therapy evaluations completed.

## 2012-12-03 NOTE — Progress Notes (Signed)
PT Cancellation Note  Patient Details Name: Johnny Mathis MRN: [id number] DOB: 05-Jun-1960   Cancelled Treatment:    Reason Eval/Treat Not Completed: Patient not medically ready. Pt planned for ORIF of L plateau fx this afternoon. PT to re-attempt eval if appropriate tomorrow.   Marcene Brawn 12/01/2012, 10:07 AM

## 2012-12-03 NOTE — Interval H&P Note (Signed)
History and Physical Interval Note:  12/06/2012 2:55 PM  Johnny Mathis  has presented today for surgery, with the diagnosis of Left Tibia plateau fracture  The various methods of treatment have been discussed with the patient and family. After consideration of risks, benefits and other options for treatment, the patient has consented to  Procedure(s): OPEN REDUCTION INTERNAL FIXATION (ORIF) TIBIAL PLATEAU (Left) as a surgical intervention .  The patient's history has been reviewed, patient examined, no change in status, stable for surgery.  I have reviewed the patient's chart and labs.  Questions were answered to the patient's satisfaction.     Annagrace Carr C

## 2012-12-03 NOTE — Interval H&P Note (Signed)
History and Physical Interval Note:  12/13/2012 12:36 PM  Johnny Mathis  has presented today for surgery, with the diagnosis of Left Tibia plateau fracture  The various methods of treatment have been discussed with the patient and family. After consideration of risks, benefits and other options for treatment, the patient has consented to  Procedure(s): OPEN REDUCTION INTERNAL FIXATION (ORIF) TIBIAL PLATEAU (Left) as a surgical intervention .  The patient's history has been reviewed, patient examined, no change in status, stable for surgery.  I have reviewed the patient's chart and labs.  Questions were answered to the patient's satisfaction.     Thorn Demas C

## 2012-12-03 NOTE — Progress Notes (Signed)
Patient ID: Johnny Mathis, male   DOB: 22-Dec-1960, 52 y.o.   MRN: [id number]   LOS: 2 days   Subjective: More comfortable with change in bed.   Objective: Vital signs in last 24 hours: Temp:  [99 F (37.2 C)-102.1 F (38.9 C)] 100.6 F (38.1 C) (12/03 0600) Pulse Rate:  [51-82] 65 (12/03 0700) Resp:  [10-24] 14 (12/03 0700) BP: (95-130)/(36-64) 102/50 mmHg (12/03 0700) SpO2:  [91 %-99 %] 98 % (12/03 0700) Last BM Date: 12/31/2012   Physical Exam General appearance: alert and no distress Resp: clear to auscultation bilaterally Cardio: irregularly irregular rhythm GI: normal findings: bowel sounds normal and soft, non-tender Neuro: NSC   Assessment/Plan: PHBC  SCI w/incomplete quadriplegia s/p ACDF -- PT/OT in cervical collar Left tib/fib fx -- OR today Facial abrasions/lacs -- Local care  FEN -- Check UA, CXR with temp spike VTE -- SCD's, Lovenox  Dispo -- OR today, floor when bed available, PT/OT, CIR consult    Freeman Caldron, PA-C Pager: [phone] General Trauma PA Pager: [phone]   12/10/2012

## 2012-12-03 NOTE — Anesthesia Preprocedure Evaluation (Addendum)
Anesthesia Evaluation  Patient identified by MRN, date of birth, ID band Patient awake    Reviewed: Allergy & Precautions, H&P , NPO status , Patient's Chart, lab work & pertinent test results  Airway Mallampati: II      Dental  (+) Teeth Intact   Pulmonary  breath sounds clear to auscultation        Cardiovascular Rhythm:Regular Rate:Normal     Neuro/Psych    GI/Hepatic   Endo/Other    Renal/GU      Musculoskeletal   Abdominal   Peds  Hematology   Anesthesia Other Findings Awake and alert Neck in C-Collar  Marked weakness of upper and lower extremities due to central cord injury    Reproductive/Obstetrics                           Anesthesia Physical Anesthesia Plan  ASA: II  Anesthesia Plan:    Post-op Pain Management:    Induction:   Airway Management Planned:   Additional Equipment:   Intra-op Plan:   Post-operative Plan:   Informed Consent:   Plan Discussed with:   Anesthesia Plan Comments:         Anesthesia Quick Evaluation

## 2012-12-03 NOTE — Progress Notes (Signed)
Subjective: 2 Days Post-Op Procedure(s) (LRB): ANTERIOR CERVICAL FOUR-FIVE DECOMPRESSION/DISCECTOMY FUSION 1 LEVEL (N/A) Patient reports pain as moderate.    Objective: Vital signs in last 24 hours: Temp:  [99 F (37.2 C)-102.1 F (38.9 C)] 100 F (37.8 C) (12/03 0700) Pulse Rate:  [51-82] 65 (12/03 0700) Resp:  [10-17] 14 (12/03 0700) BP: (95-130)/(36-64) 102/50 mmHg (12/03 0700) SpO2:  [91 %-99 %] 98 % (12/03 0700)  Intake/Output from previous day: 12/02 0701 - 12/03 0700 In: 1950 [P.O.:1800; I.V.:50; IV Piggyback:100] Out: 1390 [Urine:1390] Intake/Output this shift:     Recent Labs  12/04/2012 0900 12/25/2012 0915  HGB 16.4 16.3    Recent Labs  12/18/2012 0900 12/13/2012 0915  WBC 7.9  --   RBC 5.17  --   HCT 46.6 48.0  PLT 211  --     Recent Labs  12/18/2012 0900 12/16/2012 0915  NA 138 141  K 4.3 4.3  CL 103 106  CO2 23  --   BUN 20 20  CREATININE 1.01 1.20  GLUCOSE 106* 109*  CALCIUM 8.9  --     Recent Labs  12/20/2012 0900  INR 1.02    cannot feel legs.  splint padding pulled back and compartments soft.  cap refill of toes intact.  Assessment/Plan: 2 Days Post-Op Procedure(s) (LRB): ANTERIOR CERVICAL FOUR-FIVE DECOMPRESSION/DISCECTOMY FUSION 1 LEVEL (N/A)  OR today for ORIF of tibial plateau DC LOVENOX  NPO TODAY CONSENT ON CHART  Johnny Mathis M 12/08/2012, 8:36 AM  

## 2012-12-03 NOTE — Progress Notes (Signed)
OT Cancellation Note  Patient Details Name: Johnny Mathis MRN: [id number] DOB: [birth year]   Cancelled Treatment:    Reason Eval/Treat Not Completed: Medical issues which prohibited therapy Pt scheduled for surgery this pm. Will begin tomorrow if medically appropriate. [facility] Nghia Mcentee, OTR/L  [phone] 12/05/2012 12/01/2012, 9:38 AM

## 2012-12-03 NOTE — Progress Notes (Signed)
Patient is stable and can be transferred to the floor.  This will happen after surgery.  This patient has been seen and I agree with the findings and treatment plan.  Marta Lamas. Gae Bon, MD, FACS [phone] (pager) [phone] (direct pager) Trauma Surgeon

## 2012-12-03 NOTE — Progress Notes (Signed)
Overall stable. No new issues or problems. Pain control. Swallowing well.  MAXIMUM TEMPERATURE to 102 last night. Currently afebrile. Vitals are stable. Wound clean and dry. Neurological exam unchanged.  Overall stable. Continue efforts at mobilization.

## 2012-12-03 NOTE — Brief Op Note (Signed)
12/16/2012 - 2012/12/12  4:27 PM  PATIENT:  Johnny Mathis  52 y.o. male  PRE-OPERATIVE DIAGNOSIS:  Left Tibia plateau fracture  POST-OPERATIVE DIAGNOSIS:  Left bicondylar tibial plateau fracture  PROCEDURE:  Procedure(s): OPEN REDUCTION INTERNAL FIXATION (ORIF) TIBIAL PLATEAU (Left)  SURGEON:  Surgeon(s) and Role:    * Eldred Manges, MD - Primary  PHYSICIAN ASSISTANT: Maud Deed PAC  ASSISTANTS: none   ANESTHESIA:   general  EBL:  Total I/O In: -  Out: 325 [Urine:325]  BLOOD ADMINISTERED:none  DRAINS: none   LOCAL MEDICATIONS USED:  NONE  SPECIMEN:  No Specimen  DISPOSITION OF SPECIMEN:  N/A  COUNTS:  YES  TOURNIQUET:  yes  DICTATION: .Note written in EPIC  PLAN OF CARE: Admit to inpatient   PATIENT DISPOSITION:  PACU - hemodynamically stable.   Delay start of Pharmacological VTE agent (>24hrs) due to surgical blood loss or risk of bleeding: yes

## 2012-12-03 NOTE — H&P (View-Only) (Signed)
Subjective: 2 Days Post-Op Procedure(s) (LRB): ANTERIOR CERVICAL FOUR-FIVE DECOMPRESSION/DISCECTOMY FUSION 1 LEVEL (N/A) Patient reports pain as moderate.    Objective: Vital signs in last 24 hours: Temp:  [99 F (37.2 C)-102.1 F (38.9 C)] 100 F (37.8 C) (12/03 0700) Pulse Rate:  [51-82] 65 (12/03 0700) Resp:  [10-17] 14 (12/03 0700) BP: (95-130)/(36-64) 102/50 mmHg (12/03 0700) SpO2:  [91 %-99 %] 98 % (12/03 0700)  Intake/Output from previous day: 12/02 0701 - 12/03 0700 In: 1950 [P.O.:1800; I.V.:50; IV Piggyback:100] Out: 1390 [Urine:1390] Intake/Output this shift:     Recent Labs  12/20/2012 0900 12/25/2012 0915  HGB 16.4 16.3    Recent Labs  12/28/2012 0900 12/31/2012 0915  WBC 7.9  --   RBC 5.17  --   HCT 46.6 48.0  PLT 211  --     Recent Labs  12/29/2012 0900 12/15/2012 0915  NA 138 141  K 4.3 4.3  CL 103 106  CO2 23  --   BUN 20 20  CREATININE 1.01 1.20  GLUCOSE 106* 109*  CALCIUM 8.9  --     Recent Labs  12/16/2012 0900  INR 1.02    cannot feel legs.  splint padding pulled back and compartments soft.  cap refill of toes intact.  Assessment/Plan: 2 Days Post-Op Procedure(s) (LRB): ANTERIOR CERVICAL FOUR-FIVE DECOMPRESSION/DISCECTOMY FUSION 1 LEVEL (N/A)  OR today for ORIF of tibial plateau DC LOVENOX  NPO TODAY CONSENT ON CHART  Macall Mccroskey M 12/21/2012, 8:36 AM

## 2012-12-03 NOTE — Preoperative (Signed)
Beta Blockers   Reason not to administer Beta Blockers:Not Applicable 

## 2012-12-03 NOTE — Transfer of Care (Signed)
Immediate Anesthesia Transfer of Care Note  Patient: Johnny Mathis  Procedure(s) Performed: Procedure(s): OPEN REDUCTION INTERNAL FIXATION (ORIF) TIBIAL PLATEAU- left (Left)  Patient Location: PACU  Anesthesia Type:General  Level of Consciousness: awake and patient cooperative  Airway & Oxygen Therapy: Patient Spontanous Breathing and Patient connected to nasal cannula oxygen  Post-op Assessment: Report given to PACU RN, Post -op Vital signs reviewed and stable and Patient moving all extremities  Post vital signs: Reviewed and stable  Complications: No apparent anesthesia complications

## 2012-12-04 DIAGNOSIS — N390 Urinary tract infection, site not specified: Secondary | ICD-10-CM | POA: Diagnosis not present

## 2012-12-04 DIAGNOSIS — I4949 Other premature depolarization: Secondary | ICD-10-CM

## 2012-12-04 LAB — CBC
HCT: 34 % — ABNORMAL LOW (ref 39.0–52.0)
Hemoglobin: 11.4 g/dL — ABNORMAL LOW (ref 13.0–17.0)
MCHC: 33.5 g/dL (ref 30.0–36.0)
MCV: 93.4 fL (ref 78.0–100.0)
RDW: 12.7 % (ref 11.5–15.5)
WBC: 12.2 10*3/uL — ABNORMAL HIGH (ref 4.0–10.5)

## 2012-12-04 LAB — BASIC METABOLIC PANEL
BUN: 20 mg/dL (ref 6–23)
Creatinine, Ser: 1.17 mg/dL (ref 0.50–1.35)
GFR calc Af Amer: 82 mL/min — ABNORMAL LOW (ref >90)
GFR calc non Af Amer: 71 mL/min — ABNORMAL LOW (ref >90)

## 2012-12-04 LAB — URINE CULTURE: Colony Count: NO GROWTH

## 2012-12-04 MED ORDER — GUAIFENESIN ER 600 MG PO TB12: 1200.0000 mg | ORAL_TABLET | Freq: Two times a day (BID) | ORAL | Status: DC

## 2012-12-04 MED ORDER — GUAIFENESIN ER 600 MG PO TB12
1200.0000 mg | ORAL_TABLET | Freq: Two times a day (BID) | ORAL | Status: DC
  Administered 2012-12-04 (×2): 1200 mg via ORAL
  Filled 2012-12-04 (×2): qty 2

## 2012-12-04 MED ORDER — BETHANECHOL CHLORIDE 25 MG PO TABS
25.0000 mg | ORAL_TABLET | Freq: Four times a day (QID) | ORAL | Status: DC
  Administered 2012-12-04 (×4): 25 mg via ORAL
  Filled 2012-12-04 (×4): qty 1

## 2012-12-04 MED ORDER — BACLOFEN 10 MG PO TABS
10.0000 mg | ORAL_TABLET | Freq: Three times a day (TID) | ORAL | Status: DC
  Administered 2012-12-04 (×2): 10 mg via ORAL
  Filled 2012-12-04: qty 1

## 2012-12-04 NOTE — Progress Notes (Signed)
Patient ID: Johnny Mathis, male   DOB: August 20, 1960, 52 y.o.   MRN: [id number]   LOS: 3 days   Subjective: No new c/o though Dr. Marlene Bast comments noted. OT still notes lots of spasticity.   Objective: Vital signs in last 24 hours: Temp:  [98.2 F (36.8 C)-100.7 F (38.2 C)] 100.7 F (38.2 C) (12/04 0433) Pulse Rate:  [51-83] 83 (12/04 0650) Resp:  [10-18] 18 (12/04 0433) BP: (92-124)/(45-60) 117/58 mmHg (12/04 0650) SpO2:  [90 %-100 %] 95 % (12/04 0433) Last BM Date: 12/18/2012 (per patient)   IS:   Laboratory  CBC  Recent Labs  12/04/12 0630  WBC 12.2*  HGB 11.4*  HCT 34.0*  PLT 145*    Physical Exam General appearance: alert and no distress Resp: clear to auscultation bilaterally Cardio: regular rate and rhythm GI: Soft, +BS   Assessment/Plan: PHBC  SCI w/incomplete quadriplegia s/p ACDF -- PT/OT in cervical collar. Given higher level cord involvement than I originally thought with some sputum production will get ST to eval swallow. I doubt aspiration however. Will increase baclofen for the spasticity. Left tib/fib fx s/p ORIF -- NWB Facial abrasions/lacs -- Local care  UTI -- Cipro D2/7 FEN -- As above. Give mucinex. Should probably give voiding trial, especially in light of UTI. VTE -- SCD's, Lovenox  Dispo -- CIR vs SNF    Freeman Caldron, PA-C Pager: [phone] General Trauma PA Pager: [phone]   12/04/2012
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
## 2012-12-04 NOTE — Progress Notes (Signed)
Postop day 3. Still intermittently with low-grade fever. Patient with significant atelectasis. Pulmonary toilet ongoing.  Minimal neck pain. No radiating pain.  Vitals are stable. Neurologically he is awake and alert. Still with 4 minus/5 strength bilateral biceps and wrist extensors. Triceps and grips and hand intrinsics remain 0/5. Lower extremity strength remains very spastic with mixed voluntary and involuntary movements. Wound clean and dry.  Overall stable. Continue cervical collar. Continue efforts at rehabilitation.
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
## 2012-12-04 NOTE — Progress Notes (Signed)
Did 1500cc on IS for me. Continue therapies. Frequent PVCs which seem asymptomatic - will have cardiology evaluate. Patient examined and I agree with the assessment and plan  Violeta Gelinas, MD, MPH, FACS Pager: [phone]  12/04/2012 11:45 AM

## 2012-12-05 ENCOUNTER — Inpatient Hospital Stay (HOSPITAL_COMMUNITY): Payer: No Typology Code available for payment source | Admitting: Certified Registered Nurse Anesthetist

## 2012-12-05 ENCOUNTER — Encounter (HOSPITAL_COMMUNITY): Payer: Self-pay | Admitting: Radiology

## 2012-12-05 ENCOUNTER — Inpatient Hospital Stay (HOSPITAL_COMMUNITY): Payer: No Typology Code available for payment source

## 2012-12-05 ENCOUNTER — Encounter (HOSPITAL_COMMUNITY): Payer: No Typology Code available for payment source | Admitting: Certified Registered Nurse Anesthetist

## 2012-12-05 DIAGNOSIS — N179 Acute kidney failure, unspecified: Secondary | ICD-10-CM

## 2012-12-05 DIAGNOSIS — A419 Sepsis, unspecified organism: Secondary | ICD-10-CM

## 2012-12-05 DIAGNOSIS — I4949 Other premature depolarization: Secondary | ICD-10-CM

## 2012-12-05 DIAGNOSIS — J95821 Acute postprocedural respiratory failure: Secondary | ICD-10-CM

## 2012-12-05 DIAGNOSIS — I517 Cardiomegaly: Secondary | ICD-10-CM

## 2012-12-05 DIAGNOSIS — R6521 Severe sepsis with septic shock: Secondary | ICD-10-CM

## 2012-12-05 LAB — POCT I-STAT 3, ART BLOOD GAS (G3+)
Bicarbonate: 19.6 mEq/L — ABNORMAL LOW (ref 20.0–24.0)
O2 Saturation: 99 %
TCO2: 21 mmol/L (ref 0–100)
pCO2 arterial: 34.4 mmHg — ABNORMAL LOW (ref 35.0–45.0)
pO2, Arterial: 165 mmHg — ABNORMAL HIGH (ref 80.0–100.0)

## 2012-12-05 LAB — BASIC METABOLIC PANEL
BUN: 34 mg/dL — ABNORMAL HIGH (ref 6–23)
BUN: 37 mg/dL — ABNORMAL HIGH (ref 6–23)
BUN: 34 mg/dL — ABNORMAL HIGH (ref 6–23)
CO2: 20 mEq/L (ref 19–32)
CO2: 20 mEq/L (ref 19–32)
Calcium: 7.7 mg/dL — ABNORMAL LOW (ref 8.4–10.5)
Calcium: 7.7 mg/dL — ABNORMAL LOW (ref 8.4–10.5)
Chloride: 94 mEq/L — ABNORMAL LOW (ref 96–112)
Creatinine, Ser: 1.91 mg/dL — ABNORMAL HIGH (ref 0.50–1.35)
Creatinine, Ser: 1.67 mg/dL — ABNORMAL HIGH (ref 0.50–1.35)
Creatinine, Ser: 1.25 mg/dL (ref 0.50–1.35)
GFR calc Af Amer: 45 mL/min — ABNORMAL LOW (ref >90)
GFR calc non Af Amer: 39 mL/min — ABNORMAL LOW (ref >90)
Glucose, Bld: 174 mg/dL — ABNORMAL HIGH (ref 70–99)
Glucose, Bld: 137 mg/dL — ABNORMAL HIGH (ref 70–99)
Sodium: 128 mEq/L — ABNORMAL LOW (ref 135–145)

## 2012-12-05 LAB — TRIGLYCERIDES: Triglycerides: 65 mg/dL (ref <150)

## 2012-12-05 LAB — LACTIC ACID, PLASMA: Lactic Acid, Venous: 1.7 mmol/L (ref 0.5–2.2)

## 2012-12-05 LAB — URINALYSIS, ROUTINE W REFLEX MICROSCOPIC
Ketones, ur: 15 mg/dL — AB
Nitrite: NEGATIVE
Specific Gravity, Urine: 1.023 (ref 1.005–1.030)
Urobilinogen, UA: 0.2 mg/dL (ref 0.0–1.0)

## 2012-12-05 LAB — URINE MICROSCOPIC-ADD ON

## 2012-12-05 LAB — GLUCOSE, CAPILLARY: Glucose-Capillary: 146 mg/dL — ABNORMAL HIGH (ref 70–99)

## 2012-12-05 LAB — CBC
HCT: 31.8 % — ABNORMAL LOW (ref 39.0–52.0)
MCH: 31.7 pg (ref 26.0–34.0)
MCHC: 34.6 g/dL (ref 30.0–36.0)
MCV: 91.6 fL (ref 78.0–100.0)
RDW: 12.9 % (ref 11.5–15.5)

## 2012-12-05 MED ORDER — ACETAMINOPHEN 160 MG/5ML PO SOLN
650.0000 mg | Freq: Four times a day (QID) | ORAL | Status: DC | PRN
  Administered 2012-12-06 – 2012-12-15 (×7): 650 mg
  Filled 2012-12-05 (×8): qty 20.3

## 2012-12-05 MED ORDER — PRO-STAT SUGAR FREE PO LIQD
30.0000 mL | Freq: Every day | ORAL | Status: DC
  Administered 2012-12-05 – 2012-12-16 (×45): 30 mL
  Filled 2012-12-05 (×61): qty 30

## 2012-12-05 MED ORDER — FENTANYL CITRATE 0.05 MG/ML IJ SOLN: 100.0000 ug | INTRAMUSCULAR | Status: DC | PRN

## 2012-12-05 MED ORDER — VANCOMYCIN HCL IN DEXTROSE 1-5 GM/200ML-% IV SOLN
1000.0000 mg | Freq: Three times a day (TID) | INTRAVENOUS | Status: DC
  Administered 2012-12-05: 1000 mg via INTRAVENOUS
  Filled 2012-12-05 (×2): qty 200

## 2012-12-05 MED ORDER — ALBUMIN HUMAN 5 % IV SOLN
25.0000 g | Freq: Once | INTRAVENOUS | Status: AC
  Administered 2012-12-05: 25 g via INTRAVENOUS
  Filled 2012-12-05: qty 500

## 2012-12-05 MED ORDER — POTASSIUM CHLORIDE 10 MEQ/50ML IV SOLN
10.0000 meq | INTRAVENOUS | Status: AC
  Administered 2012-12-05 (×8): 10 meq via INTRAVENOUS
  Filled 2012-12-05 (×5): qty 50

## 2012-12-05 MED ORDER — SODIUM CHLORIDE 0.9 % IV SOLN
500.0000 mg | Freq: Two times a day (BID) | INTRAVENOUS | Status: DC
  Administered 2012-12-05 – 2012-12-10 (×11): 500 mg via INTRAVENOUS
  Filled 2012-12-05 (×13): qty 5

## 2012-12-05 MED ORDER — VITAMIN E 15 UNIT/0.3ML PO SOLN
400.0000 [IU] | Freq: Three times a day (TID) | ORAL | Status: DC
  Administered 2012-12-05 – 2012-12-11 (×16): 400 [IU]
  Filled 2012-12-05 (×21): qty 8

## 2012-12-05 MED ORDER — SUCCINYLCHOLINE CHLORIDE 20 MG/ML IJ SOLN
INTRAMUSCULAR | Status: DC | PRN
  Administered 2012-12-05: 120 mg via INTRAVENOUS

## 2012-12-05 MED ORDER — SELENIUM 50 MCG PO TABS
200.0000 ug | ORAL_TABLET | Freq: Every day | ORAL | Status: DC
  Administered 2012-12-05 – 2012-12-10 (×5): 200 ug
  Filled 2012-12-05 (×7): qty 4

## 2012-12-05 MED ORDER — VANCOMYCIN HCL IN DEXTROSE 750-5 MG/150ML-% IV SOLN
750.0000 mg | Freq: Two times a day (BID) | INTRAVENOUS | Status: DC
  Administered 2012-12-05 – 2012-12-08 (×6): 750 mg via INTRAVENOUS
  Filled 2012-12-05 (×7): qty 150

## 2012-12-05 MED ORDER — PIPERACILLIN-TAZOBACTAM 3.375 G IVPB
3.3750 g | Freq: Three times a day (TID) | INTRAVENOUS | Status: DC
  Administered 2012-12-05: 3.375 g via INTRAVENOUS
  Filled 2012-12-05 (×4): qty 50

## 2012-12-05 MED ORDER — BACLOFEN 10 MG PO TABS
10.0000 mg | ORAL_TABLET | Freq: Three times a day (TID) | ORAL | Status: DC
  Administered 2012-12-05 – 2012-12-07 (×8): 10 mg
  Filled 2012-12-05 (×12): qty 1

## 2012-12-05 MED ORDER — ACETAMINOPHEN 160 MG/5ML PO SOLN: 650.0000 mg | Freq: Four times a day (QID) | ORAL | Status: DC | PRN

## 2012-12-05 MED ORDER — PROPOFOL 10 MG/ML IV BOLUS
INTRAVENOUS | Status: DC | PRN
  Administered 2012-12-05: 80 mg via INTRAVENOUS

## 2012-12-05 MED ORDER — ACETAMINOPHEN 10 MG/ML IV SOLN
1000.0000 mg | Freq: Once | INTRAVENOUS | Status: AC
  Administered 2012-12-05: 1000 mg via INTRAVENOUS
  Filled 2012-12-05: qty 100

## 2012-12-05 MED ORDER — CHLORHEXIDINE GLUCONATE 0.12 % MT SOLN
15.0000 mL | Freq: Two times a day (BID) | OROMUCOSAL | Status: DC
  Administered 2012-12-05 – 2012-12-16 (×23): 15 mL via OROMUCOSAL
  Filled 2012-12-05 (×25): qty 15

## 2012-12-05 MED ORDER — POTASSIUM CHLORIDE 2 MEQ/ML IV SOLN
INTRAVENOUS | Status: DC
  Administered 2012-12-05 – 2012-12-10 (×8): via INTRAVENOUS
  Filled 2012-12-05 (×14): qty 1000

## 2012-12-05 MED ORDER — BIOTENE DRY MOUTH MT LIQD
15.0000 mL | Freq: Four times a day (QID) | OROMUCOSAL | Status: DC
  Administered 2012-12-05 – 2012-12-16 (×42): 15 mL via OROMUCOSAL

## 2012-12-05 MED ORDER — PANTOPRAZOLE SODIUM 40 MG IV SOLR
40.0000 mg | INTRAVENOUS | Status: DC
  Administered 2012-12-05: 40 mg via INTRAVENOUS
  Filled 2012-12-05 (×2): qty 40

## 2012-12-05 MED ORDER — PROPOFOL 10 MG/ML IV EMUL
0.0000 ug/kg/min | INTRAVENOUS | Status: DC
  Administered 2012-12-05: 15 ug/kg/min via INTRAVENOUS

## 2012-12-05 MED ORDER — PROPOFOL 10 MG/ML IV EMUL
INTRAVENOUS | Status: AC
  Filled 2012-12-05: qty 100

## 2012-12-05 MED ORDER — ALBUMIN HUMAN 5 % IV SOLN
INTRAVENOUS | Status: AC
  Filled 2012-12-05: qty 250

## 2012-12-05 MED ORDER — CYCLOBENZAPRINE HCL 10 MG PO TABS
10.0000 mg | ORAL_TABLET | Freq: Three times a day (TID) | ORAL | Status: DC | PRN
  Filled 2012-12-05: qty 1

## 2012-12-05 MED ORDER — PIVOT 1.5 CAL PO LIQD
1000.0000 mL | ORAL | Status: DC
  Administered 2012-12-05 – 2012-12-10 (×3): 1000 mL
  Administered 2012-12-11: 40 mL
  Administered 2012-12-12 – 2012-12-16 (×5): 1000 mL
  Filled 2012-12-05 (×18): qty 1000

## 2012-12-05 MED ORDER — PIPERACILLIN-TAZOBACTAM 3.375 G IVPB
3.3750 g | Freq: Three times a day (TID) | INTRAVENOUS | Status: DC
  Administered 2012-12-05 – 2012-12-09 (×12): 3.375 g via INTRAVENOUS
  Filled 2012-12-05 (×14): qty 50

## 2012-12-05 MED ORDER — VITAMIN C 500 MG PO TABS
1000.0000 mg | ORAL_TABLET | Freq: Three times a day (TID) | ORAL | Status: DC
  Administered 2012-12-05 – 2012-12-11 (×16): 1000 mg
  Filled 2012-12-05 (×21): qty 2

## 2012-12-05 MED ORDER — ENOXAPARIN SODIUM 30 MG/0.3ML ~~LOC~~ SOLN
30.0000 mg | Freq: Two times a day (BID) | SUBCUTANEOUS | Status: DC
Start: 2012-12-05 — End: 2012-12-06
  Administered 2012-12-05 (×2): 30 mg via SUBCUTANEOUS
  Filled 2012-12-05 (×4): qty 0.3

## 2012-12-05 MED ORDER — FAMOTIDINE 20 MG PO TABS
20.0000 mg | ORAL_TABLET | Freq: Every day | ORAL | Status: DC
  Filled 2012-12-05: qty 1

## 2012-12-05 MED ORDER — NOREPINEPHRINE BITARTRATE 1 MG/ML IJ SOLN
2.0000 ug/min | INTRAVENOUS | Status: DC
  Administered 2012-12-05: 10 ug/min via INTRAVENOUS
  Administered 2012-12-05: 5 ug/min via INTRAVENOUS
  Administered 2012-12-05: 2 ug/min via INTRAVENOUS
  Filled 2012-12-05 (×5): qty 4

## 2012-12-05 MED ORDER — VANCOMYCIN HCL IN DEXTROSE 1-5 GM/200ML-% IV SOLN
1000.0000 mg | Freq: Once | INTRAVENOUS | Status: AC
  Administered 2012-12-05: 1000 mg via INTRAVENOUS
  Filled 2012-12-05: qty 200

## 2012-12-05 NOTE — Progress Notes (Signed)
Patient ID: Johnny Mathis, male   DOB: [birth year], 52 y.o.   MRN: [id number] I was notified by nursing staff the patient was found with decreased level of consciousness. After they began assessing him, he had a large-volume of coffee-ground type emesis. In light of the above changes, he was transferred to ICU. On my arrival, he was in some respiratory distress with respiratory rate in the 30s. He was unresponsive. Pupils are reactive. Lung exam revealed rhonchi bilaterally. Heart was regular in the 120s. Anesthesia proceeded with intubation. Endotracheal tube suctioning revealed some coffee-ground emesis associated with aspiration. This was a small amount. I proceeded with emergency placement of a central line. We'll check chest x-ray, labs, and cultures. We'll begin Empiric broad-spectrum antibiotics. Critical care 40 minutes. Violeta Gelinas, MD, MPH, FACS Pager: [phone]

## 2012-12-05 NOTE — Progress Notes (Signed)
Events of last night noted.  Stable from my standpoint.  Wound looks good.

## 2012-12-05 NOTE — Progress Notes (Signed)
Patient ID: Johnny Mathis, male   DOB: 06-01-1960, 52 y.o.   MRN: [id number]   LOS: 4 days   Subjective: On vent   Objective: Vital signs in last 24 hours: Temp:  [98.2 F (36.8 C)-108 F (42.2 C)] 103.6 F (39.8 C) (12/05 0715) Pulse Rate:  [34-123] 96 (12/05 0740) Resp:  [16-30] 16 (12/05 0740) BP: (70-210)/(37-163) 101/66 mmHg (12/05 0740) SpO2:  [90 %-100 %] 100 % (12/05 0740) FiO2 (%):  [40 %-100 %] 40 % (12/05 0740) Last BM Date: 12/18/2012   VENT: PRVC/40%/5PEEP/RR16/Vt662ml   UOP: 37ml/h NET: +915ml/24h TOTAL: +3931ml/admission   Laboratory CBC  Recent Labs  12/04/12 0630 12/05/12 0213  WBC 12.2* 12.4*  HGB 11.4* 11.0*  HCT 34.0* 31.8*  PLT 145* 97*   BMET  Recent Labs  12/04/12 1850 12/05/12 0213  NA 127* 128*  K 3.9 3.9  CL 93* 94*  CO2 25 23  GLUCOSE 134* 124*  BUN 20 34*  CREATININE 1.17 1.91*  CALCIUM 8.0* 7.7*   CBG (last 3)   Recent Labs  12/05/12 0107  GLUCAP 146*    Physical Exam General appearance: no distress Resp: rhonchi bilaterally Cardio: regular rate and rhythm GI: Soft, +BS Pulses: 2+ and symmetric Neuro: PERRL, GCS [id number]   ID Ancef  12/1 -- 12/3 Cipro   12/3 -- 12/4 Vanc  12/5 -- Present Zosyn  12/5 -- Present  Urine  12/5 Pending Blood  12/5 Pending   Assessment/Plan: PHBC  SCI w/incomplete quadriplegia s/p ACDF   Left tib/fib fx s/p ORIF -- NWB  Facial abrasions/lacs -- Local care  ARF -- Maintain on vent Septic shock -- On Levophed @17mcg /min. Given lack of TBI I would favor tolerating higher fevers but will d/w MD. Cultures pending, continue Vanc/Zosyn D#1. AMS -- Needs HCT, will require either travel or bed change. As he is stable now I would favor travel for the technically better study. Will order. AKI -- Monitor UTI -- Cipro D2/7, now on Zosyn D3/7 Hyponatremia -- Reduce IVF Thrombocytopenia -- Monitor FEN -- Start TF VTE -- SCD's, Start Lovenox  Dispo -- Septic shock   Critical  care time: 0740 -- 0825   Freeman Caldron, PA-C Pager: [phone] General Trauma PA Pager: [phone]   12/05/2012

## 2012-12-05 NOTE — Progress Notes (Signed)
Dr. Janee Morn gave verbal order for OG tube to be placed.  Vanessa Ralphs, RN
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
## 2012-12-05 NOTE — Progress Notes (Signed)
UR completed.  Perris Tripathi, RN BSN [facility] CCM Trauma/Neuro ICU Case Manager [phone]  
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
## 2012-12-05 NOTE — Progress Notes (Signed)
Patient ID: Johnny Mathis, male   DOB: 05-09-1960, 52 y.o.   MRN: [id number] Last night's events noted.  Patient now intubated and on norepinephrine, suspected septic shock.  No significant arrhythmias on telemetry (rare PVCs).  As per my consult note yesterday, will obtain echocardiogram.  Otherwise, no further cardiac workup necessary at this time, management per primary service.   Marca Ancona 12/05/2012 8:31 AM

## 2012-12-05 NOTE — Progress Notes (Signed)
Dr. Janee Morn gave verbal order for temp foli to be placed.  Vanessa Ralphs, RN

## 2012-12-05 NOTE — Progress Notes (Signed)
Pt noted to be less responsive, will open and roll eyes but non verbal, skin warm to touch. VS 98.2,126/66,123,21,91% on room air. Rapid response RN notified. RR RN at bedside, rectal temp obtained 108 F. Tylenol 650 mg suppository given rectally and applied cold compress. Pt started throwing up large amount of coffee ground emesis and became unresponsive. VSS  98.6, 126/66, 24,  121, 91% on 2LPM Trauma md called and ordered to transfer pt to ICU.Pt transferred to 3M09, bedside report given to receiving RN.

## 2012-12-05 NOTE — Progress Notes (Signed)
Paged Trauma MD concerning SBP differences between right and left arms.  Dr. Janee Morn returned call and order A-Line to be placed.  Vanessa Ralphs, RN
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
## 2012-12-05 NOTE — Progress Notes (Signed)
Subjective: 2 Days Post-Op Procedure(s) (LRB): OPEN REDUCTION INTERNAL FIXATION (ORIF) TIBIAL PLATEAU- left (Left) Patient reports pain as pt intubated.    Objective: Vital signs in last 24 hours: Temp:  [98.2 F (36.8 C)-108 F (42.2 C)] 100.6 F (38.1 C) (12/05 1315) Pulse Rate:  [34-123] 80 (12/05 1315) Resp:  [16-30] 17 (12/05 1315) BP: (70-210)/(37-163) 99/70 mmHg (12/05 1315) SpO2:  [90 %-100 %] 100 % (12/05 1315) Arterial Line BP: (108-150)/(61-80) 117/66 mmHg (12/05 1315) FiO2 (%):  [40 %-100 %] 40 % (12/05 1207) Weight:  [120.158 kg (264 lb 14.4 oz)] 120.158 kg (264 lb 14.4 oz) (12/05 1000)  Intake/Output from previous day: 12/04 0701 - 12/05 0700 In: 2736.2 [P.O.:720; I.V.:1026.2; IV Piggyback:955] Out: 1760 [Urine:1400; Emesis/NG output:350; Drains:10] Intake/Output this shift: Total I/O In: 1145.3 [I.V.:655; NG/GT:140.3; IV Piggyback:350] Out: 820 [Urine:720; Emesis/NG output:100]   Recent Labs  12/04/12 0630 12/05/12 0213  HGB 11.4* 11.0*    Recent Labs  12/04/12 0630 12/05/12 0213  WBC 12.2* 12.4*  RBC 3.64* 3.47*  HCT 34.0* 31.8*  PLT 145* 97*    Recent Labs  12/05/12 0213 12/05/12 1045  NA 128* 128*  K 3.9 2.9*  CL 94* 94*  CO2 23 20  BUN 34* 37*  CREATININE 1.91* 1.67*  GLUCOSE 124* 174*  CALCIUM 7.7* 7.5*   No results found for this basename: LABPT, INR,  in the last 72 hours  hemovac drain removed without difficulty.  calf soft. feet cool but has cooling blanket on.  DP pulse +1 on right.  Assessment/Plan: 2 Days Post-Op Procedure(s) (LRB): OPEN REDUCTION INTERNAL FIXATION (ORIF) TIBIAL PLATEAU- left (Left) Up with therapy once extubated and recovering.  Elevate leg and keep splint dry and clean for now.  Tajana Crotteau M 12/05/2012, 1:25 PM

## 2012-12-05 NOTE — Progress Notes (Signed)
Agree that fevers do not need to be treated aggressively.  He very likely aspirated.  Coffee-ground emesis suctioned from ETT.  On appropriate empiric antibiotics.  Awaiting CT head.  Not sure if EEG will be necessary.  This patient has been seen and I agree with the findings and treatment plan.  Marta Lamas. Gae Bon, MD, FACS [phone] (pager) [phone] (direct pager) Trauma Surgeon

## 2012-12-05 NOTE — Progress Notes (Signed)
SLP Cancellation Note  Patient Details Name: Johnny Mathis MRN: [id number] DOB: 04/25/1960   Cancelled treatment:       Reason Eval/Treat Not Completed: Medical issues which prohibited therapy. Pt now intubated will follow for readiness.    Melannie Metzner, Riley Nearing 12/05/2012, 8:18 AM

## 2012-12-05 NOTE — Progress Notes (Signed)
  Echocardiogram 2D Echocardiogram has been performed.  Rayce Brahmbhatt FRANCES 12/05/2012, 4:46 PM

## 2012-12-05 NOTE — Procedures (Signed)
Arterial Catheter Insertion Procedure Note Kwadwo Taras [id number] 08-04-1960  Procedure: Insertion of Arterial Catheter  Indications: Blood pressure monitoring  Procedure Details Consent: Unable to obtain consent because of emergent medical necessity. Time Out: Verified patient identification, verified procedure, site/side was marked, verified correct patient position, special equipment/implants available, medications/allergies/relevent history reviewed, required imaging and test results available.  Performed  Maximum sterile technique was used including antiseptics, cap, gloves, gown, hand hygiene, mask and sheet. Skin prep: Chlorhexidine; local anesthetic administered 20 gauge catheter was inserted into right radial artery using the Seldinger technique.  Evaluation Blood flow good; BP tracing good. Complications: No apparent complications.   Morley Kos 12/05/2012

## 2012-12-05 NOTE — Anesthesia Procedure Notes (Signed)
Procedure Name: Intubation Date/Time: 12/05/2012 1:35 AM Performed by: Julianne Rice Z Pre-anesthesia Checklist: Patient identified, Patient being monitored, Timeout performed, Emergency Drugs available and Suction available Patient Re-evaluated:Patient Re-evaluated prior to inductionOxygen Delivery Method: Ambu bag Preoxygenation: Pre-oxygenation with 100% oxygen Intubation Type: IV induction, Rapid sequence and Cricoid Pressure applied Grade View: Grade II Tube type: Oral Tube size: 8.0 mm Number of attempts: 1 Airway Equipment and Method: Stylet and Video-laryngoscopy Placement Confirmation: ETT inserted through vocal cords under direct vision,  breath sounds checked- equal and bilateral and positive ETCO2 Secured at: 24 cm Tube secured with: Tape Dental Injury: Teeth and Oropharynx as per pre-operative assessment  Difficulty Due To: Difficulty was anticipated, Difficult Airway-  due to neck instability, Difficult Airway- due to cervical collar and Difficult Airway- due to reduced neck mobility Future Recommendations: Recommend- induction with short-acting agent, and alternative techniques readily available Comments: Called to emergently intubate pt. by Dr. Janee Morn; Pt. vomited prior to arrival in ICU; grossly lethargic not following directions; unable to maintain airway.   Glide Scope utilized; Dr. Jacklynn Bue present ; intubated with ease; coffee color gastric contents seen in pharynx. ETT suctioned for same appearing fluid.

## 2012-12-05 NOTE — Significant Event (Signed)
Rapid Response Event Note  Overview: Time Called: 0019 Arrival Time: 0025 Event Type: Neurologic;Unknown  Initial Focused Assessment: Called by bedside RN regarding mental status change. Bedside RN stated patient had been talking to her earlier in the shift and had taken his medications without difficulty. Upon arrival to room patient unresponsive to sternal rub, positive cough/gag, HR 120s, BP 126/62, RR 28 irregular and snoring at times, rectal temp 108. Patient suctioned small amount of thick tan sputum. Minimal air movement with rhonchi noted in all lung fields. Patient repositioned in bed and vomited a large amount of coffee ground emesis while in the upright position.  Interventions: MD notified, rectal Tylenol given, NS bolus initiated, and ice packs placed on patient. Orders received to transfer and setup for intubation on unit. Patient transferred to ICU with bedside RN, RRT RN, and RT. Patient arrived on 41m without incident. MD and anesthesiologist at bedside for intubation and central line placement  Event Summary: Name of Physician Notified: Dr. Janee Morn at 0030    at    Outcome: Transferred (Comment) ([id number])  Event End Time: 0130  Burna Forts Isidoro Donning

## 2012-12-05 NOTE — Procedures (Signed)
Central Venous Catheter Insertion Procedure Note Johnny Mathis [id number] [birth year]  Procedure: Insertion of Central Venous Catheter Indications: Drug and/or fluid administration  Procedure Details Consent: Unable to obtain consent because of emergent medical necessity. no family available. Time Out: Verified patient identification, verified procedure, site/side was marked, verified correct patient position, special equipment/implants available, medications/allergies/relevent history reviewed, required imaging and test results available.  Performed  Maximum sterile technique was used including antiseptics, cap, gloves, gown, hand hygiene, mask and sheet. Skin prep: Chlorhexidine; local anesthetic administered A antimicrobial bonded/coated triple lumen catheter was placed in the left subclavian vein using the Seldinger technique.  Evaluation Blood flow good Complications: No apparent complications Patient did tolerate procedure well. Chest X-ray ordered to verify placement.  CXR: pending.  Johnny Mathis E 12/05/2012, 2:20 AM

## 2012-12-06 ENCOUNTER — Inpatient Hospital Stay (HOSPITAL_COMMUNITY): Payer: No Typology Code available for payment source

## 2012-12-06 DIAGNOSIS — N179 Acute kidney failure, unspecified: Secondary | ICD-10-CM | POA: Diagnosis not present

## 2012-12-06 DIAGNOSIS — A419 Sepsis, unspecified organism: Secondary | ICD-10-CM | POA: Diagnosis not present

## 2012-12-06 DIAGNOSIS — J9600 Acute respiratory failure, unspecified whether with hypoxia or hypercapnia: Secondary | ICD-10-CM

## 2012-12-06 DIAGNOSIS — E871 Hypo-osmolality and hyponatremia: Secondary | ICD-10-CM | POA: Diagnosis not present

## 2012-12-06 DIAGNOSIS — D696 Thrombocytopenia, unspecified: Secondary | ICD-10-CM | POA: Diagnosis not present

## 2012-12-06 LAB — BASIC METABOLIC PANEL
BUN: 31 mg/dL — ABNORMAL HIGH (ref 6–23)
CO2: 23 mEq/L (ref 19–32)
Calcium: 7.5 mg/dL — ABNORMAL LOW (ref 8.4–10.5)
Chloride: 99 mEq/L (ref 96–112)
Creatinine, Ser: 1.21 mg/dL (ref 0.50–1.35)
GFR calc Af Amer: 79 mL/min — ABNORMAL LOW (ref >90)
Sodium: 133 mEq/L — ABNORMAL LOW (ref 135–145)

## 2012-12-06 LAB — CBC
HCT: 28.2 % — ABNORMAL LOW (ref 39.0–52.0)
MCH: 31.4 pg (ref 26.0–34.0)
MCHC: 35.8 g/dL (ref 30.0–36.0)
MCV: 87.6 fL (ref 78.0–100.0)
RDW: 13.5 % (ref 11.5–15.5)
WBC: 17.1 10*3/uL — ABNORMAL HIGH (ref 4.0–10.5)

## 2012-12-06 LAB — URINE CULTURE
Colony Count: NO GROWTH
Culture: NO GROWTH

## 2012-12-06 MED ORDER — SODIUM CHLORIDE 0.9 % IV SOLN
80.0000 mg | Freq: Two times a day (BID) | INTRAVENOUS | Status: DC
  Administered 2012-12-06 – 2012-12-10 (×10): 80 mg via INTRAVENOUS
  Filled 2012-12-06 (×22): qty 80
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
## 2012-12-06 NOTE — Progress Notes (Signed)
I have seen and examined the pt and agree with PA-Jeffery's progress note. Con't abx Agree with increasing PPI HIT panel pending, hold Lovenox

## 2012-12-06 NOTE — Progress Notes (Signed)
Tracheal aspirate obtained/sent to lab. 

## 2012-12-06 NOTE — Progress Notes (Signed)
Pt vomited a second time. OG tube connected to LIS and PO meds held.
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
## 2012-12-06 NOTE — Progress Notes (Signed)
Pt vomiting large amount of coffee ground colored emesis. Feeding discontinued and iv protonix ordered. Will continue to monitor.

## 2012-12-06 NOTE — Progress Notes (Signed)
Patient ID: Johnny Mathis, male   DOB: [birth year], 52 y.o.   MRN: [id number]   LOS: 5 days   Subjective: Not consistently FC. RN reports bloody urine and blood from around the catheter. Continues with coffee ground emesis.   Objective: Vital signs in last 24 hours: Temp:  [100 F (37.8 C)-103.6 F (39.8 C)] 100.4 F (38 C) (12/06 0915) Pulse Rate:  [74-95] 74 (12/06 0915) Resp:  [14-25] 21 (12/06 0600) BP: (89-146)/(59-98) 114/85 mmHg (12/06 0900) SpO2:  [98 %-100 %] 100 % (12/06 0915) Arterial Line BP: (102-158)/(56-92) 118/61 mmHg (12/06 0915) FiO2 (%):  [40 %] 40 % (12/06 0400) Weight:  [270 lb 11.6 oz (122.8 kg)] 270 lb 11.6 oz (122.8 kg) (12/06 0500) Last BM Date: 12/10/12   VENT: PRVC/40%/5PEEP/RR16/Vt691ml   UOP: 68ml/h NET: +1375ml/24h TOTAL: +5317ml/admission   Laboratory CBC  Recent Labs  12/05/12 0213 12/06/12 0500  WBC 12.4* 17.1*  HGB 11.0* 10.1*  HCT 31.8* 28.2*  PLT 97* PLATELETS APPEAR DECREASED   BMET  Recent Labs  12/05/12 2000 12/06/12 0500  NA 128* 133*  K 3.6 3.9  CL 96 99  CO2 20 23  GLUCOSE 137* 149*  BUN 34* 31*  CREATININE 1.25 1.21  CALCIUM 7.7* 7.5*    Radiology PORTABLE CHEST - 1 VIEW  COMPARISON: December 05, 2012.  FINDINGS:  The lungs are adequately inflated and clear. The cardiopericardial  silhouette is top-normal in size. The pulmonary vascularity is not  engorged. There is no pleural effusion or pneumothorax. The observed  portions of the bony thorax exhibit no acute abnormalities.  The endotracheal tube tip lies 4 cm above the crotch of the carina.  The esophagogastric tube tip and proximal port project off the  inferior margin of the film. The left subclavian venous catheter tip  lies at the level of the junction of the right and left  brachiocephalic veins.  IMPRESSION:  There is no evidence of pneumonia nor of pulmonary edema nor other  acute cardiopulmonary disease. The support tubes and lines are in   place as described.  Electronically Signed  By: David [place]  On: 12/06/2012 08:03   Physical Exam General appearance: Mildly agitated Resp: clear to auscultation bilaterally and distant Cardio: regular rate and rhythm GI: Soft Male genitalia: blood around foreskin Extremities: Warm Neuro: [id number]   ID Ancef   12/1 -- 12/3  Cipro   12/3 -- 12/4  Vanc   12/5 -- Present  Zosyn   12/5 -- Present   Urine   12/5  Pending  Blood   12/5  Pending   Assessment/Plan: PHBC  SCI w/incomplete quadriplegia s/p ACDF  Left tib/fib fx s/p ORIF -- NWB  Facial abrasions/lacs -- Local care  ARF -- Maintain on vent  Septic shock -- Levophed down to 80mcg/min. WBC elevated, fevers continue. Cultures pending, continue Vanc/Zosyn D#2. Will check BAL though CXR looks clean. AMS -- From shock? HCT normal. UTI -- Cipro D2/7, now on Zosyn D3/7  Hyponatremia -- Improved Thrombocytopenia -- Monitor  FEN -- Increase Protonix for what I suspect is an upper GIB VTE -- SCD's, Stop Lovenox with decreased plts and bleeding (likely GI and urinary tract). Check HIT panel. Check dopplers. Dispo -- Septic shock   Critical care time: 0955 -- 1035    Freeman Caldron, PA-C Pager: [phone] General Trauma PA Pager: [phone]  12/06/2012

## 2012-12-07 ENCOUNTER — Inpatient Hospital Stay (HOSPITAL_COMMUNITY): Payer: No Typology Code available for payment source

## 2012-12-07 DIAGNOSIS — M7989 Other specified soft tissue disorders: Secondary | ICD-10-CM

## 2012-12-07 DIAGNOSIS — D62 Acute posthemorrhagic anemia: Secondary | ICD-10-CM

## 2012-12-07 LAB — URINE CULTURE: Colony Count: 2000

## 2012-12-07 LAB — BASIC METABOLIC PANEL
CO2: 25 mEq/L (ref 19–32)
Calcium: 7.6 mg/dL — ABNORMAL LOW (ref 8.4–10.5)
Chloride: 100 mEq/L (ref 96–112)
GFR calc Af Amer: 73 mL/min — ABNORMAL LOW (ref >90)
Glucose, Bld: 105 mg/dL — ABNORMAL HIGH (ref 70–99)
Potassium: 4.3 mEq/L (ref 3.5–5.1)
Sodium: 133 mEq/L — ABNORMAL LOW (ref 135–145)

## 2012-12-07 LAB — CBC
Hemoglobin: 8.8 g/dL — ABNORMAL LOW (ref 13.0–17.0)
MCH: 30.9 pg (ref 26.0–34.0)
Platelets: 33 10*3/uL — ABNORMAL LOW (ref 150–400)
RBC: 2.85 MIL/uL — ABNORMAL LOW (ref 4.22–5.81)
RDW: 13.8 % (ref 11.5–15.5)
WBC: 11.3 10*3/uL — ABNORMAL HIGH (ref 4.0–10.5)

## 2012-12-07 MED ORDER — FENTANYL CITRATE 0.05 MG/ML IJ SOLN
50.0000 ug | INTRAMUSCULAR | Status: DC | PRN
  Administered 2012-12-07 – 2012-12-12 (×10): 50 ug via INTRAVENOUS
  Filled 2012-12-07 (×9): qty 2

## 2012-12-07 NOTE — Progress Notes (Signed)
Subjective: Patient reports sedated on vent  Objective: Vital signs in last 24 hours: Temp:  [98.6 F (37 C)-103.1 F (39.5 C)] 100.9 F (38.3 C) (12/07 0800) Pulse Rate:  [64-91] 77 (12/07 0800) Resp:  [16-28] 20 (12/07 0800) BP: (74-133)/(50-80) 74/57 mmHg (12/07 0800) SpO2:  [92 %-100 %] 100 % (12/07 0800) Arterial Line BP: (84-138)/(40-65) 137/58 mmHg (12/07 0800) FiO2 (%):  [30 %-40 %] 30 % (12/07 0816) Weight:  [119.7 kg (263 lb 14.3 oz)] 119.7 kg (263 lb 14.3 oz) (12/07 0400)  Intake/Output from previous day: 12/06 0701 - 12/07 0700 In: 2057.5 [I.V.:1252.5; NG/GT:50; IV Piggyback:755] Out: 2125 [Urine:1400; Emesis/NG output:725] Intake/Output this shift: Total I/O In: 200 [I.V.:50; IV Piggyback:150] Out: 560 [Urine:260; Emesis/NG output:300]  Physical Exam: Off pressors.  Still not awake or following commands.  Moving extremities spontaneously.  Lab Results:  Recent Labs  12/06/12 0500 12/07/12 0520  WBC 17.1* 11.3*  HGB 10.1* 8.8*  HCT 28.2* 26.1*  PLT PLATELETS APPEAR DECREASED 33*   BMET  Recent Labs  12/06/12 0500 12/07/12 0520  NA 133* 133*  K 3.9 4.3  CL 99 100  CO2 23 25  GLUCOSE 149* 105*  BUN 31* 31*  CREATININE 1.21 1.28  CALCIUM 7.5* 7.6*    Studies/Results: Dg Chest Port 1 View  12/07/2012   CLINICAL DATA:  Acute respiratory failure.  EXAM: PORTABLE CHEST - 1 VIEW  COMPARISON:  12/06/2012 and 12/05/2012  FINDINGS: Endotracheal tube, central line, and NG tube appear unchanged. Heart size and vascularity are normal. Lungs are clear.  IMPRESSION: No active disease.   Electronically Signed   By: Geanie Cooley M.D.   On: 12/07/2012 09:02   Dg Chest Port 1 View  12/06/2012   EXAM: PORTABLE CHEST - 1 VIEW  COMPARISON:  December 05, 2012.  FINDINGS: The lungs are adequately inflated and clear. The cardiopericardial silhouette is top-normal in size. The pulmonary vascularity is not engorged. There is no pleural effusion or pneumothorax. The  observed portions of the bony thorax exhibit no acute abnormalities.  The endotracheal tube tip lies 4 cm above the crotch of the carina. The esophagogastric tube tip and proximal port project off the inferior margin of the film. The left subclavian venous catheter tip lies at the level of the junction of the right and left brachiocephalic veins.  IMPRESSION: There is no evidence of pneumonia nor of pulmonary edema nor other acute cardiopulmonary disease. The support tubes and lines are in place as described.   Electronically Signed   By: David  [place]   On: 12/06/2012 08:03    Assessment/Plan: Continued support and management of sepsis.  Dopplers of legs pending.  No new Neurosurgical issues.    LOS: 6 days    Dorian Heckle, MD 12/07/2012, 9:59 AM

## 2012-12-07 NOTE — Progress Notes (Signed)
Patient ID: Johnny Mathis, male   DOB: January 15, 1960, 52 y.o.   MRN: [id number]   LOS: 6 days   Subjective: Sedated, on vent. +FC.   Objective: Vital signs in last 24 hours: Temp:  [98.6 F (37 C)-103.1 F (39.5 C)] 100.9 F (38.3 C) (12/07 0800) Pulse Rate:  [64-93] 77 (12/07 0800) Resp:  [16-28] 20 (12/07 0800) BP: (74-133)/(50-82) 74/57 mmHg (12/07 0800) SpO2:  [92 %-100 %] 100 % (12/07 0800) Arterial Line BP: (84-138)/(40-65) 137/58 mmHg (12/07 0800) FiO2 (%):  [30 %-40 %] 30 % (12/07 0816) Weight:  [263 lb 14.3 oz (119.7 kg)] 263 lb 14.3 oz (119.7 kg) (12/07 0400) Last BM Date: 12/06/12   VENT: CPAP/PS   UOP: 28ml/h NET: -73ml/24h TOTAL: +5351ml/admission   Laboratory CBC  Recent Labs  12/06/12 0500 12/07/12 0520  WBC 17.1* 11.3*  HGB 10.1* 8.8*  HCT 28.2* 26.1*  PLT PLATELETS APPEAR DECREASED 33*   BMET  Recent Labs  12/06/12 0500 12/07/12 0520  NA 133* 133*  K 3.9 4.3  CL 99 100  CO2 23 25  GLUCOSE 149* 105*  BUN 31* 31*  CREATININE 1.21 1.28  CALCIUM 7.5* 7.6*    Radiology PORTABLE CHEST - 1 VIEW  COMPARISON: 12/06/2012 and 12/05/2012  FINDINGS:  Endotracheal tube, central line, and NG tube appear unchanged. Heart  size and vascularity are normal. Lungs are clear.  IMPRESSION:  No active disease.  Electronically Signed  By: Geanie Cooley M.D.  On: 12/07/2012 09:02   Physical Exam General appearance: no distress Resp: clear to auscultation bilaterally Cardio: regular rate and rhythm GI: Soft, +BS   ID Ancef   12/1 -- 12/3  Cipro   12/3 -- 12/4  Vanc   12/5 -- Present  Zosyn   12/5 -- Present   Urine   12/5  Pending  Blood   12/5  Pending   Assessment/Plan: PHBC  SCI w/incomplete quadriplegia s/p ACDF  Left tib/fib fx s/p ORIF -- NWB  Facial abrasions/lacs -- Local care  ARF -- Wean ID -- Pressors off since yesterday. WBC down, fevers continue. Cultures pending, continue Vanc/Zosyn D#3.  AMS -- From shock? HCT normal.  FC this morning. ABL anemia -- Down as expected. Follow. UTI -- Cipro x2d, now on Zosyn D5/7 total Hyponatremia -- Stable Thrombocytopenia -- Will transfuse, suspect sepsis as a cause but HIT panel pending. FEN -- High dose Protonix for what I suspect is an upper GIB  VTE -- SCD's. Check HIT panel. Check dopplers.  Dispo -- ARF   Critical care time: 0900 -- 0930    Freeman Caldron, PA-C Pager: [phone] General Trauma PA Pager: [phone]   12/07/2012
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
## 2012-12-07 NOTE — Progress Notes (Signed)
Ortho on call MD notified of need to remove L leg splint and drsg for venous dopplers per Trauma PA order. Okay to remove. Order to leave splint off. Redress with soft dressing and ace wrap. Readdress when pt mobilizing.

## 2012-12-07 NOTE — Progress Notes (Signed)
VASCULAR LAB PRELIMINARY  PRELIMINARY  PRELIMINARY  PRELIMINARY  Left upper and bilateral lower extremity venous Dopplers completed.    Preliminary report:  There is no DVT or SVT noted in the left upper or bilateral lower extremities.   Eliana Lueth, RVT 12/07/2012, 10:40 AM
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
## 2012-12-07 NOTE — Progress Notes (Signed)
Seen and agree.  Continue supportive care  WBC better.

## 2012-12-08 ENCOUNTER — Inpatient Hospital Stay (HOSPITAL_COMMUNITY): Payer: No Typology Code available for payment source

## 2012-12-08 DIAGNOSIS — I428 Other cardiomyopathies: Secondary | ICD-10-CM

## 2012-12-08 LAB — CULTURE, RESPIRATORY W GRAM STAIN: Culture: NO GROWTH

## 2012-12-08 LAB — PREPARE PLATELET PHERESIS: Unit division: 0

## 2012-12-08 LAB — CBC
HCT: 25.4 % — ABNORMAL LOW (ref 39.0–52.0)
Hemoglobin: 8.7 g/dL — ABNORMAL LOW (ref 13.0–17.0)
MCH: 31.6 pg (ref 26.0–34.0)
MCHC: 34.3 g/dL (ref 30.0–36.0)
Platelets: 100 10*3/uL — ABNORMAL LOW (ref 150–400)
RDW: 13.9 % (ref 11.5–15.5)

## 2012-12-08 LAB — BASIC METABOLIC PANEL
BUN: 26 mg/dL — ABNORMAL HIGH (ref 6–23)
Calcium: 7.5 mg/dL — ABNORMAL LOW (ref 8.4–10.5)
Creatinine, Ser: 1.04 mg/dL (ref 0.50–1.35)
GFR calc Af Amer: >90 mL/min (ref >90)
GFR calc non Af Amer: 81 mL/min — ABNORMAL LOW (ref >90)
Glucose, Bld: 98 mg/dL (ref 70–99)
Potassium: 4 mEq/L (ref 3.5–5.1)

## 2012-12-08 MED ORDER — FONDAPARINUX SODIUM 2.5 MG/0.5ML ~~LOC~~ SOLN
2.5000 mg | Freq: Every day | SUBCUTANEOUS | Status: DC
  Administered 2012-12-08 – 2012-12-11 (×4): 2.5 mg via SUBCUTANEOUS
  Filled 2012-12-08 (×5): qty 0.5

## 2012-12-08 MED ORDER — VANCOMYCIN HCL IN DEXTROSE 750-5 MG/150ML-% IV SOLN
750.0000 mg | Freq: Three times a day (TID) | INTRAVENOUS | Status: AC
  Administered 2012-12-08 – 2012-12-10 (×6): 750 mg via INTRAVENOUS
  Filled 2012-12-08 (×6): qty 150

## 2012-12-08 MED ORDER — CARVEDILOL 6.25 MG PO TABS
6.2500 mg | ORAL_TABLET | Freq: Two times a day (BID) | ORAL | Status: DC
  Administered 2012-12-08 – 2012-12-15 (×15): 6.25 mg via ORAL
  Filled 2012-12-08 (×19): qty 1

## 2012-12-08 MED ORDER — BACLOFEN 10 MG PO TABS
15.0000 mg | ORAL_TABLET | Freq: Three times a day (TID) | ORAL | Status: DC
  Administered 2012-12-08 – 2012-12-10 (×7): 15 mg
  Filled 2012-12-08 (×12): qty 1

## 2012-12-08 NOTE — Progress Notes (Signed)
Vent weaning progressing.  Motor exam appears stable.  No new recs
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
## 2012-12-08 NOTE — Clinical Social Work Note (Signed)
Clinical Social Worker continuing to follow patient for support and discharge planning needs.  Patient is currently on the ventilator, questionably following commands.  Patient brother has reached out to RN staff for medical information, however patient did not make any mention of a brother during assessment.  Patient was very clear in stating that his closest relative would be his uncle and that all contact be made through him.  Per MD, patient hopefully extubated in the next day or two.  Once patient extubated, CSW will communicate with patient directly regarding information sharing with patient brother.  Patient will likely need SNF placement at discharge - FL2 complete.  CSW to update FL2 and initiate search once patient is off the ventilator.  CSW remains available for support and to facilitate patient discharge needs once medically stable.  [name], [place] [phone]

## 2012-12-08 NOTE — Progress Notes (Signed)
Subjective: 5 Days Post-Op Procedure(s) (LRB): OPEN REDUCTION INTERNAL FIXATION (ORIF) TIBIAL PLATEAU- left (Left) Patient reports pain as pt intubated.    Objective: Vital signs in last 24 hours: Temp:  [100.2 F (37.9 C)-101.8 F (38.8 C)] 101.3 F (38.5 C) (12/08 1000) Pulse Rate:  [67-84] 82 (12/08 1000) Resp:  [17-35] 35 (12/08 1000) BP: (90-137)/(48-81) 118/68 mmHg (12/08 1000) SpO2:  [96 %-100 %] 100 % (12/08 1000) Arterial Line BP: (109-156)/(42-85) 143/60 mmHg (12/08 1000) FiO2 (%):  [30 %] 30 % (12/08 1000) Weight:  [116.4 kg (256 lb 9.9 oz)] 116.4 kg (256 lb 9.9 oz) (12/08 0500)  Intake/Output from previous day: 12/07 0701 - 12/08 0700 In: 2602.5 [I.V.:1230; Blood:225; NG/GT:300; IV Piggyback:847.5] Out: 2890 [Urine:1990; Emesis/NG output:900] Intake/Output this shift: Total I/O In: 300 [I.V.:150; IV Piggyback:150] Out: 250 [Urine:250]   Recent Labs  12/06/12 0500 12/07/12 0520 12/08/12 0535  HGB 10.1* 8.8* 8.7*    Recent Labs  12/07/12 0520 12/08/12 0535  WBC 11.3* 13.8*  RBC 2.85* 2.75*  HCT 26.1* 25.4*  PLT 33* 100*    Recent Labs  12/07/12 0520 12/08/12 0535  NA 133* 131*  K 4.3 4.0  CL 100 101  CO2 25 23  BUN 31* 26*  CREATININE 1.28 1.04  GLUCOSE 105* 98  CALCIUM 7.6* 7.5*   No results found for this basename: LABPT, INR,  in the last 72 hours  Compartment soft involuntary movement of legs.  knee flexed left leg.  dressing intact.  cap refill of toes intact.  Assessment/Plan: 5 Days Post-Op Procedure(s) (LRB): OPEN REDUCTION INTERNAL FIXATION (ORIF) TIBIAL PLATEAU- left (Left) Continue attempt at elevation of leg.    Dennise Raabe M 12/08/2012, 10:32 AM
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
## 2012-12-08 NOTE — Progress Notes (Signed)
Weaning pretty well on 5/5 now. Only weaned 2h yesterday. Will see how he does. Hope to extubate later today vs tomorrow. HIT panel P. F/U resp CXs. Patient examined and I agree with the assessment and plan  Violeta Gelinas, MD, MPH, FACS Pager: [phone]  12/08/2012 9:21 AM

## 2012-12-08 NOTE — Progress Notes (Signed)
Patient ID: Johnny Mathis, male   DOB: [birth year], 52 y.o.   MRN: [id number]   LOS: 7 days   Subjective: On vent, arouses easily, FC.   Objective: Vital signs in last 24 hours: Temp:  [100.2 F (37.9 C)-101.8 F (38.8 C)] 100.8 F (38.2 C) (12/08 0600) Pulse Rate:  [67-84] 76 (12/08 0600) Resp:  [17-26] 25 (12/08 0600) BP: (74-137)/(48-81) 137/61 mmHg (12/08 0600) SpO2:  [96 %-100 %] 99 % (12/08 0600) Arterial Line BP: (109-140)/(42-63) 139/57 mmHg (12/08 0600) FiO2 (%):  [30 %-40 %] 30 % (12/08 0423) Weight:  [256 lb 9.9 oz (116.4 kg)] 256 lb 9.9 oz (116.4 kg) (12/08 0500) Last BM Date: 12/06/12   VENT: PRVC/30%/5PEEP/RR16/Vt673ml   UOP: 26ml/h NET: -335ml/24h TOTAL: +4930ml/admission   Laboratory CBC  Recent Labs  12/07/12 0520 12/08/12 0535  WBC 11.3* 13.8*  HGB 8.8* 8.7*  HCT 26.1* 25.4*  PLT 33* 100*   BMET  Recent Labs  12/07/12 0520 12/08/12 0535  NA 133* 131*  K 4.3 4.0  CL 100 101  CO2 25 23  GLUCOSE 105* 98  BUN 31* 26*  CREATININE 1.28 1.04  CALCIUM 7.6* 7.5*   CBG (last 3)   Recent Labs  12/05/12 1540 12/05/12 2017  GLUCAP 119* 110*    Radiology CXR: ETT high, otherwise stable   Physical Exam General appearance: alert and no distress Resp: rhonchi bilaterally and mild Cardio: regular rate and rhythm GI: normal findings: bowel sounds normal and soft, non-tender Extremities: Warm   ID Ancef   12/1 -- 12/3  Cipro   12/3 -- 12/4  Vanc   12/5 -- Present  Zosyn   12/5 -- Present   Urine   12/5  Enterococcus sp, pansensitive Blood   12/5  Pending Sputum 12/6 Pending   Assessment/Plan: PHBC  SCI w/incomplete quadriplegia s/p ACDF  Left tib/fib fx s/p ORIF -- NWB  Facial abrasions/lacs -- Local care  ARF -- Wean as able. Given improved mental status could possibly extubate later today. Will have RT advance ETT. ID -- WBC still mildly elevated, fevers continue. Urine culture with Enterococus sensitive to vanc so should  be well treated. Other cultures pending, continue Vanc/Zosyn D#4.  AMS -- Resolved ABL anemia -- Stable Hyponatremia -- Stable  Thrombocytopenia -- Improved after transfusion FEN -- High dose Protonix for what I suspect is an upper GIB. If doesn't extubate today should restart TF. VTE -- SCD's. Dopplers negative. Will start Arixtra for prophylaxis while awaiting HIT panel. Dispo -- ARF   Critical care time: 0715 -- 0745    Freeman Caldron, PA-C Pager: [phone] General Trauma PA Pager: [phone]  12/08/2012

## 2012-12-08 NOTE — Progress Notes (Signed)
Rehab admissions - Evaluated for possible admission.  Please see rehab MD consult done by Dr. Riley Kill.  I expect that patient will need SNF.  Recommend pursuit of SNF at this time.  Call me for questions.  #[phone]

## 2012-12-08 NOTE — Progress Notes (Signed)
Advanced ETT 3CM per order

## 2012-12-09 ENCOUNTER — Inpatient Hospital Stay (HOSPITAL_COMMUNITY): Payer: No Typology Code available for payment source

## 2012-12-09 ENCOUNTER — Encounter (HOSPITAL_COMMUNITY): Payer: Self-pay | Admitting: Neurosurgery

## 2012-12-09 LAB — CBC
MCH: 31.4 pg (ref 26.0–34.0)
MCHC: 34.3 g/dL (ref 30.0–36.0)
MCV: 91.6 fL (ref 78.0–100.0)
Platelets: 157 10*3/uL (ref 150–400)
RBC: 2.96 MIL/uL — ABNORMAL LOW (ref 4.22–5.81)
RDW: 13.5 % (ref 11.5–15.5)
WBC: 11.9 10*3/uL — ABNORMAL HIGH (ref 4.0–10.5)

## 2012-12-09 LAB — BASIC METABOLIC PANEL
BUN: 20 mg/dL (ref 6–23)
Calcium: 8 mg/dL — ABNORMAL LOW (ref 8.4–10.5)
Chloride: 103 mEq/L (ref 96–112)
Creatinine, Ser: 0.79 mg/dL (ref 0.50–1.35)
GFR calc Af Amer: >90 mL/min (ref >90)
GFR calc non Af Amer: >90 mL/min (ref >90)
Sodium: 134 mEq/L — ABNORMAL LOW (ref 135–145)

## 2012-12-09 LAB — HEPARIN INDUCED THROMBOCYTOPENIA PNL
Patient O.D.: 0.271
UFH Low Dose 0.1 IU/mL: 0 % Release
UFH Low Dose 0.5 IU/mL: 0 % Release

## 2012-12-09 NOTE — Procedures (Signed)
Extubation Procedure Note  Patient Details:   Name: Johnny Mathis DOB: Aug 15, 1960 MRN: [id number]   Airway Documentation:  Airway 8 mm (Active)  Secured at (cm) 26 cm 12/09/2012  7:29 AM  Measured From Lips 12/09/2012  7:29 AM  Secured Location Right 12/09/2012  7:29 AM  Secured By [employer] 12/09/2012  7:29 AM  Tube Holder Repositioned Yes 12/09/2012  7:29 AM  Cuff Pressure (cm H2O) 25 cm H2O 12/08/2012  7:00 PM  Site Condition Dry 12/09/2012  3:38 AM    Evaluation  O2 sats: stable throughout Complications: No apparent complications Patient did tolerate procedure well. Bilateral Breath Sounds: Clear;Diminished Suctioning: Airway No Positive cuff leak prior to extubation [name], MA 12/09/2012, 8:47 AM

## 2012-12-09 NOTE — Addendum Note (Signed)
Addendum created 12/09/12 0907 by Sharee Holster, MD   Modules edited: Anesthesia Responsible Staff

## 2012-12-09 NOTE — Progress Notes (Addendum)
Extubated. Patient hemodynamically stable. Still intermittently febrile. He is awake minimally aware. Encephalopathic likely secondary to sepsis and shock.  Motor examination remained stable. Dressing clean and dry.  Status post incomplete cervical spinal cord injury. Continue supportive efforts and critical care.

## 2012-12-09 NOTE — Progress Notes (Signed)
Patient ID: Johnny Mathis, male   DOB: Jun 30, 1960, 52 y.o.   MRN: [id number] Follow up - Trauma Critical Care  Patient Details:    Johnny Mathis is an 52 y.o. male.  Lines/tubes : Airway 8 mm (Active)  Secured at (cm) 26 cm 12/09/2012  7:29 AM  Measured From Lips 12/09/2012  7:29 AM  Secured Location Right 12/09/2012  7:29 AM  Secured By [employer] 12/09/2012  7:29 AM  Tube Holder Repositioned Yes 12/09/2012  7:29 AM  Cuff Pressure (cm H2O) 25 cm H2O 12/08/2012  7:00 PM  Site Condition Dry 12/09/2012  3:38 AM     CVC Triple Lumen 12/05/12 Left Subclavian (Active)  Indication for Insertion or Continuance of Line Prolonged intravenous therapies 12/09/2012  8:00 AM  Site Assessment Clean;Dry;Intact 12/09/2012  8:00 AM  Proximal Lumen Status Infusing 12/09/2012  8:00 AM  Medial Infusing 12/09/2012  8:00 AM  Distal Lumen Status Saline locked 12/09/2012  8:00 AM  Dressing Type Transparent 12/09/2012  8:00 AM  Dressing Status Clean;Dry;Intact 12/09/2012  8:00 AM  Line Care Connections checked and tightened 12/09/2012  8:00 AM  Dressing Intervention Dressing changed 12/05/2012 10:00 PM  Dressing Change Due 12/11/12 12/08/2012  8:00 PM     Arterial Line 12/05/12 Right Radial (Active)  Site Assessment Clean;Dry;Intact 12/09/2012  8:00 AM  Line Status Pulsatile blood flow 12/09/2012  8:00 AM  Art Line Waveform Appropriate 12/09/2012  8:00 AM  Art Line Interventions Zeroed and calibrated 12/09/2012  8:00 AM  Color/Movement/Sensation Capillary refill less than 3 sec 12/09/2012  8:00 AM  Dressing Type Transparent 12/09/2012  8:00 AM  Dressing Status Clean;Dry;Intact 12/09/2012  8:00 AM     NG/OG Tube Orogastric 16 Fr. Center mouth (Active)  Placement Verification Auscultation 12/09/2012  7:36 AM  Site Assessment Clean;Dry;Intact 12/09/2012  7:36 AM  Status Suction-low intermittent 12/09/2012  7:36 AM  Drainage Appearance Yellow 12/09/2012  7:36 AM  Gastric Residual 0 mL 12/05/2012  8:00 PM  Intake  (mL) 850 mL 12/09/2012  6:00 AM  Output (mL) 100 mL 12/08/2012  6:00 AM     Urethral Catheter Latex;Straight-tip (Active)  Indication for Insertion or Continuance of Catheter Acute urinary retention 12/09/2012  8:00 AM  Site Assessment Clean;Intact;Dry 12/08/2012  8:00 PM  Catheter Maintenance Bag below level of bladder;Catheter secured;Drainage bag/tubing not touching floor 12/08/2012  8:00 PM  Collection Container Standard drainage bag 12/08/2012  8:00 PM  Securement Method Leg strap 12/08/2012  8:00 PM  Urinary Catheter Interventions Unclamped 12/08/2012  7:54 AM  Output (mL) 100 mL 12/08/2012  6:00 AM    Microbiology/Sepsis markers: Results for orders placed during the hospital encounter of 12/30/12  MRSA PCR SCREENING     Status: None   Collection Time    December 30, 2012  3:21 PM      Result Value Range Status   MRSA by PCR NEGATIVE  NEGATIVE Final   Comment:            The GeneXpert MRSA Assay (FDA     approved for NASAL specimens     only), is one component of a     comprehensive MRSA colonization     surveillance program. It is not     intended to diagnose MRSA     infection nor to guide or     monitor treatment for     MRSA infections.  URINE CULTURE     Status: None   Collection Time    12/23/2012  8:29 AM  Result Value Range Status   Specimen Description URINE, CATHETERIZED   Final   Special Requests NONE   Final   Culture  Setup Time     Final   Value: December 23, 2012 10:56     Performed at [facility]   Colony Count     Final   Value: NO GROWTH     Performed at [facility]   Culture     Final   Value: NO GROWTH     Performed at [facility]   Report Status 12/04/2012 FINAL   Final  URINE CULTURE     Status: None   Collection Time    12/04/12  5:24 PM      Result Value Range Status   Specimen Description URINE, CATHETERIZED   Final   Special Requests NONE   Final   Culture  Setup Time     Final   Value: 12/04/2012 17:37     Performed at [facility] Count     Final   Value: 2,000 COLONIES/ML     Performed at [facility]   Culture     Final   Value: ENTEROCOCCUS SPECIES     Performed at [facility]   Report Status 12/07/2012 FINAL   Final   Organism ID, Bacteria ENTEROCOCCUS SPECIES   Final  CULTURE, BLOOD (ROUTINE X 2)     Status: None   Collection Time    12/05/12  2:15 AM      Result Value Range Status   Specimen Description BLOOD CENTRAL LINE   Final   Special Requests BOTTLES DRAWN AEROBIC AND ANAEROBIC 10CC   Final   Culture  Setup Time     Final   Value: 12/05/2012 09:02     Performed at [facility]   Culture     Final   Value:        BLOOD CULTURE RECEIVED NO GROWTH TO DATE CULTURE WILL BE HELD FOR 5 DAYS BEFORE ISSUING A FINAL NEGATIVE REPORT     Performed at [facility]   Report Status PENDING   Incomplete  URINE CULTURE     Status: None   Collection Time    12/05/12  3:11 AM      Result Value Range Status   Specimen Description URINE, CATHETERIZED   Final   Special Requests NONE   Final   Culture  Setup Time     Final   Value: 12/05/2012 04:03     Performed at [facility] Count     Final   Value: NO GROWTH     Performed at [facility]   Culture     Final   Value: NO GROWTH     Performed at [facility]   Report Status 12/06/2012 FINAL   Final  CULTURE, BLOOD (ROUTINE X 2)     Status: None   Collection Time    12/05/12  3:25 AM      Result Value Range Status   Specimen Description BLOOD LEFT HAND   Final   Special Requests BOTTLES DRAWN AEROBIC AND ANAEROBIC [facility] EACH   Final   Culture  Setup Time     Final   Value: 12/05/2012 08:12     Performed at [facility]   Culture     Final   Value:        BLOOD CULTURE RECEIVED NO GROWTH  TO DATE CULTURE WILL BE HELD FOR 5 DAYS BEFORE ISSUING A FINAL NEGATIVE REPORT     Performed at [facility]   Report Status PENDING   Incomplete  CULTURE, RESPIRATORY  (NON-EXPECTORATED)     Status: None   Collection Time    12/06/12 12:09 PM      Result Value Range Status   Specimen Description TRACHEAL ASPIRATE   Final   Special Requests Normal   Final   Gram Stain     Final   Value: FEW WBC PRESENT, PREDOMINANTLY PMN     FEW SQUAMOUS EPITHELIAL CELLS PRESENT     NO ORGANISMS SEEN     Performed at [facility]   Culture     Final   Value: NO GROWTH 2 DAYS     Performed at [facility]   Report Status 12/08/2012 FINAL   Final    Anti-infectives:  Anti-infectives   Start     Dose/Rate Route Frequency Ordered Stop   12/08/12 1600  vancomycin (VANCOCIN) IVPB 750 mg/150 ml premix     750 mg 150 mL/hr over 60 Minutes Intravenous Every 8 hours 12/08/12 1318     12/05/12 2000  vancomycin (VANCOCIN) IVPB 750 mg/150 ml premix  Status:  Discontinued     750 mg 150 mL/hr over 60 Minutes Intravenous Every 12 hours 12/05/12 1011 12/08/12 1318   12/05/12 1200  piperacillin-tazobactam (ZOSYN) IVPB 3.375 g     3.375 g 12.5 mL/hr over 240 Minutes Intravenous 3 times per day 12/05/12 0821     12/05/12 0800  vancomycin (VANCOCIN) IVPB 1000 mg/200 mL premix  Status:  Discontinued     1,000 mg 200 mL/hr over 60 Minutes Intravenous Every 8 hours 12/05/12 0227 12/05/12 1011   12/05/12 0230  piperacillin-tazobactam (ZOSYN) IVPB 3.375 g  Status:  Discontinued     3.375 g 12.5 mL/hr over 240 Minutes Intravenous 3 times per day 12/05/12 0218 12/05/12 0821   12/05/12 0230  vancomycin (VANCOCIN) IVPB 1000 mg/200 mL premix     1,000 mg 200 mL/hr over 60 Minutes Intravenous  Once 12/05/12 0227 12/05/12 0403   12/18/2012 1700  ceFAZolin (ANCEF) IVPB 2 g/50 mL premix     2 g 100 mL/hr over 30 Minutes Intravenous  Once 12/08/2012 1651 12/31/2012 1643   12/16/2012 1300  ciprofloxacin (CIPRO) tablet 500 mg  Status:  Discontinued     500 mg Oral 2 times daily 12/08/2012 1202 12/05/12 0229   12/06/2012 0000  ceFAZolin (ANCEF) IVPB 2 g/50 mL premix     2 g 100 mL/hr  over 30 Minutes Intravenous  Once 12/02/12 1346 12/16/2012 0009   12/02/12 0200  ceFAZolin (ANCEF) IVPB 1 g/50 mL premix     1 g 100 mL/hr over 30 Minutes Intravenous Every 8 hours 12/22/2012 2134 12/02/12 1026   12/20/2012 1812  bacitracin 50,000 Units in sodium chloride irrigation 0.9 % 500 mL irrigation  Status:  Discontinued       As needed 12/28/2012 1828 12/13/2012 2000   12/16/2012 1530  ceFAZolin (ANCEF) 3 g in dextrose 5 % 50 mL IVPB     3 g 160 mL/hr over 30 Minutes Intravenous To Neuro OR-Station #32 12/06/2012 1521 12/11/2012 1757      Best Practice/Protocols:  VTE: Arixtra  Consults: Treatment Team:  Temple Pacini, MD Eldred Manges, MD [facility], MD    Studies: CXR P   Subjective:    Overnight Issues: Stable  Objective:  Vital signs for last 24 hours: Temp:  [96.8 F (36 C)-101.3 F (38.5 C)] 97.5 F (36.4 C) (12/09 0729) Pulse Rate:  [57-82] 73 (12/09 0729) Resp:  [16-35] 26 (12/09 0729) BP: (88-142)/(49-95) 142/60 mmHg (12/09 0729) SpO2:  [98 %-100 %] 100 % (12/09 0729) Arterial Line BP: (123-158)/(51-73) 158/72 mmHg (12/09 0600) FiO2 (%):  [30 %] 30 % (12/09 0729) Weight:  [255 lb 4.7 oz (115.8 kg)] 255 lb 4.7 oz (115.8 kg) (12/09 0500)  Hemodynamic parameters for last 24 hours:    Intake/Output from previous day: 12/08 0701 - 12/09 0700 In: 3115 [I.V.:1150; NG/GT:850; IV Piggyback:1115] Out: 2150 [Urine:2150]  Intake/Output this shift: Total I/O In: 250 [I.V.:100; IV Piggyback:150] Out: 175 [Urine:175]  Vent settings for last 24 hours: Vent Mode:  [-] CPAP FiO2 (%):  [30 %] 30 % Set Rate:  [16 bmp] 16 bmp Vt Set:  [630 mL] 630 mL PEEP:  [5 cmH20] 5 cmH20 Pressure Support:  [5 cmH20-8 cmH20] 5 cmH20 Plateau Pressure:  [17 cmH20-21 cmH20] 20 cmH20  Physical Exam:  General: awake on vent Neuro: opens eyes, PERL, F/C lifting UE, nods to question HEENT/Neck: ETT, collar Resp: clear to auscultation bilaterally CVS: RRR GI: SOft,  NT Extremities: LLE splint  Results for orders placed during the hospital encounter of December 08, 2012 (from the past 24 hour(s))  CBC     Status: Abnormal   Collection Time    12/09/12  5:00 AM      Result Value Range   WBC 11.9 (*) 4.0 - 10.5 K/uL   RBC 2.96 (*) 4.22 - 5.81 MIL/uL   Hemoglobin 9.3 (*) 13.0 - 17.0 g/dL   HCT 16.1 (*) 09.6 - 04.5 %   MCV 91.6  78.0 - 100.0 fL   MCH 31.4  26.0 - 34.0 pg   MCHC 34.3  30.0 - 36.0 g/dL   RDW 40.9  81.1 - 91.4 %   Platelets 157  150 - 400 K/uL  BASIC METABOLIC PANEL     Status: Abnormal   Collection Time    12/09/12  5:00 AM      Result Value Range   Sodium 134 (*) 135 - 145 mEq/L   Potassium 4.2  3.5 - 5.1 mEq/L   Chloride 103  96 - 112 mEq/L   CO2 22  19 - 32 mEq/L   Glucose, Bld 98  70 - 99 mg/dL   BUN 20  6 - 23 mg/dL   Creatinine, Ser 7.82  0.50 - 1.35 mg/dL   Calcium 8.0 (*) 8.4 - 10.5 mg/dL   GFR calc non Af Amer >90  >90 mL/min   GFR calc Af Amer >90  >90 mL/min    Assessment & Plan: Present on Admission:  **None**   LOS: 8 days   Additional comments:I reviewed the patient's new clinical lab test results. and CXR is P PHBC  SCI w/incomplete quadriplegia s/p ACDF  Left tib/fib fx s/p ORIF -- NWB  Facial abrasions/lacs -- Local care  ARF -- plan extubation this AM, weaned well all day yesterday ID -- WBC still mildly elevated, fevers continue. Urine culture with Enterococus sensitive to vanc so should be well treated. Resp CX neg so D/C Zosyn. Vanc d 5/7. AMS -- Resolved ABL anemia -- Stable Hyponatremia -- Stable  Thrombocytopenia -- Improved after transfusion and stopping heparin. HIT panel is pending.  FEN -- High dose Protonix for suspected UGI bleed. Seems to have stopped now that PLTs are better. Consider decrease  Protonix tomorrow.  VTE -- SCD's. Dopplers negative. Arixtra for prophylaxis while awaiting HIT panel. Dispo -- ICU Critical Care Total Time*: 12 Minutes  Violeta Gelinas, MD, MPH, FACS Pager:  [phone]  12/09/2012  *Care during the described time interval was provided by me and/or other providers on the critical care team.  I have reviewed this patient's available data, including medical history, events of note, physical examination and test results as part of my evaluation.
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
## 2012-12-10 ENCOUNTER — Inpatient Hospital Stay (HOSPITAL_COMMUNITY): Payer: No Typology Code available for payment source

## 2012-12-10 LAB — BASIC METABOLIC PANEL
CO2: 22 mEq/L (ref 19–32)
Calcium: 7.9 mg/dL — ABNORMAL LOW (ref 8.4–10.5)
Chloride: 107 mEq/L (ref 96–112)
Creatinine, Ser: 0.72 mg/dL (ref 0.50–1.35)
Glucose, Bld: 119 mg/dL — ABNORMAL HIGH (ref 70–99)
Potassium: 4.1 mEq/L (ref 3.5–5.1)
Sodium: 135 mEq/L (ref 135–145)

## 2012-12-10 LAB — CBC
Hemoglobin: 10 g/dL — ABNORMAL LOW (ref 13.0–17.0)
MCH: 31.9 pg (ref 26.0–34.0)
Platelets: 295 10*3/uL (ref 150–400)
RBC: 3.13 MIL/uL — ABNORMAL LOW (ref 4.22–5.81)
WBC: 12 10*3/uL — ABNORMAL HIGH (ref 4.0–10.5)

## 2012-12-10 MED ORDER — LEVETIRACETAM 100 MG/ML PO SOLN
500.0000 mg | Freq: Two times a day (BID) | ORAL | Status: DC
  Administered 2012-12-10 – 2012-12-16 (×13): 500 mg
  Filled 2012-12-10 (×15): qty 5
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
## 2012-12-10 NOTE — Progress Notes (Signed)
Trauma Service Note  Subjective: Patient not very conversant.  In no distress.  Extubated and oxygen saturations about 98% on 2L by [place]>  Objective: Vital signs in last 24 hours: Temp:  [97.5 F (36.4 C)-99.9 F (37.7 C)] 98.8 F (37.1 C) (12/10 0800) Pulse Rate:  [50-73] 64 (12/10 0800) Resp:  [18-29] 22 (12/10 0800) BP: (74-131)/(36-102) 109/58 mmHg (12/10 0800) SpO2:  [97 %-100 %] 100 % (12/10 0800) Arterial Line BP: (114-179)/(49-110) 132/51 mmHg (12/10 0800) Weight:  [111.8 kg (246 lb 7.6 oz)] 111.8 kg (246 lb 7.6 oz) (12/10 0458) Last BM Date: 12/10/12  Intake/Output from previous day: 12/09 0701 - 12/10 0700 In: 2486.5 [I.V.:1250; NG/GT:376.5; IV Piggyback:860] Out: 2950 [Urine:2950] Intake/Output this shift: Total I/O In: 290 [I.V.:50; NG/GT:90; IV Piggyback:150] Out: 295 [Urine:295]  General: No distress  Lungs: Coarse bilaterally  Sats are good  Abd: Soft, benign, tolerating tube feedings well.  Extremities: No changes.  In splints.  Neuro:   Lab Results: CBC   Recent Labs  12/09/12 0500 12/10/12 0500  WBC 11.9* 12.0*  HGB 9.3* 10.0*  HCT 27.1* 28.7*  PLT 157 295   BMET  Recent Labs  12/09/12 0500 12/10/12 0500  NA 134* 135  K 4.2 4.1  CL 103 107  CO2 22 22  GLUCOSE 98 119*  BUN 20 18  CREATININE 0.79 0.72  CALCIUM 8.0* 7.9*   PT/INR No results found for this basename: LABPROT, INR,  in the last 72 hours ABG No results found for this basename: PHART, PCO2, PO2, HCO3,  in the last 72 hours  Studies/Results: Dg Chest Port 1 View  12/10/2012   CLINICAL DATA:  Respiratory failure  EXAM: PORTABLE CHEST - 1 VIEW  COMPARISON:  12/09/2012  FINDINGS: Left subclavian central line tip terminates at the brachiocephalic/ SVC junction. Heart size is mildly enlarged, unchanged, without evidence for edema or focal parenchymal consolidation. No pleural effusion. No pneumothorax. Feeding tube noted with tip below the level of the hemidiaphragms but not  included in the field of view. Endotracheal tube has been removed. Cervical fusion hardware partly visualized.  IMPRESSION: Hypoaeration with crowding of the lung volumes but no focal acute finding.   Electronically Signed   By: Christiana Pellant M.D.   On: 12/10/2012 08:02   Dg Chest Port 1 View  12/09/2012   CLINICAL DATA:  Evaluate endotracheal tube placement.  EXAM: PORTABLE CHEST - 1 VIEW  COMPARISON:  December 08, 2012 at 6:04 a.m.  FINDINGS: The endotracheal tube tip lies just under 3 cm above the crotch of the carina. The lungs are reasonably well inflated and clear. The cardiopericardial silhouette is top-normal in size. The pulmonary vascularity is not engorged. The esophagogastric tube tip projects off the lower margin of the film. The left subclavian venous catheter tip lies at the junction of the right and left brachiocephalic veins.  IMPRESSION: The endotracheal tube has been advanced such that its tip lies just below the inferior margins of the clavicular heads approximately 2.8 cm above the crotch of the carina.   Electronically Signed   By: David  [place]   On: 12/09/2012 09:10   Dg Abd Portable 1v  12/09/2012   CLINICAL DATA:  Feeding tube placement.  EXAM: PORTABLE ABDOMEN - 1 VIEW  COMPARISON:  12/05/2012  FINDINGS: Ten a feeding tube is appreciated with tip projecting in expected course of the duodenum. Air is seen within loops of large small bowel. Visualized osseous structures unremarkable.  IMPRESSION: Feeding tube projecting  in the expected course of the duodenum. Nonspecific nonobstructive bowel gas pattern.   Electronically Signed   By: Salome Holmes M.D.   On: 12/09/2012 16:27    Anti-infectives: Anti-infectives   Start     Dose/Rate Route Frequency Ordered Stop   12/08/12 1600  vancomycin (VANCOCIN) IVPB 750 mg/150 ml premix     750 mg 150 mL/hr over 60 Minutes Intravenous Every 8 hours 12/08/12 1318 12/10/12 0852   12/05/12 2000  vancomycin (VANCOCIN) IVPB 750 mg/150 ml  premix  Status:  Discontinued     750 mg 150 mL/hr over 60 Minutes Intravenous Every 12 hours 12/05/12 1011 12/08/12 1318   12/05/12 1200  piperacillin-tazobactam (ZOSYN) IVPB 3.375 g  Status:  Discontinued     3.375 g 12.5 mL/hr over 240 Minutes Intravenous 3 times per day 12/05/12 0821 12/09/12 0850   12/05/12 0800  vancomycin (VANCOCIN) IVPB 1000 mg/200 mL premix  Status:  Discontinued     1,000 mg 200 mL/hr over 60 Minutes Intravenous Every 8 hours 12/05/12 0227 12/05/12 1011   12/05/12 0230  piperacillin-tazobactam (ZOSYN) IVPB 3.375 g  Status:  Discontinued     3.375 g 12.5 mL/hr over 240 Minutes Intravenous 3 times per day 12/05/12 0218 12/05/12 0821   12/05/12 0230  vancomycin (VANCOCIN) IVPB 1000 mg/200 mL premix     1,000 mg 200 mL/hr over 60 Minutes Intravenous  Once 12/05/12 0227 12/05/12 0403   12/02/2012 1700  ceFAZolin (ANCEF) IVPB 2 g/50 mL premix     2 g 100 mL/hr over 30 Minutes Intravenous  Once 12/31/2012 1651 12/06/2012 1643   12/20/2012 1300  ciprofloxacin (CIPRO) tablet 500 mg  Status:  Discontinued     500 mg Oral 2 times daily 12/28/2012 1202 12/05/12 0229   12/15/2012 0000  ceFAZolin (ANCEF) IVPB 2 g/50 mL premix     2 g 100 mL/hr over 30 Minutes Intravenous  Once 12/02/12 1346 12/08/2012 0009   12/02/12 0200  ceFAZolin (ANCEF) IVPB 1 g/50 mL premix     1 g 100 mL/hr over 30 Minutes Intravenous Every 8 hours 12/26/2012 2134 12/02/12 1026   12/30/2012 1812  bacitracin 50,000 Units in sodium chloride irrigation 0.9 % 500 mL irrigation  Status:  Discontinued       As needed 12/09/2012 1828 12/14/2012 2000   12/24/2012 1530  ceFAZolin (ANCEF) 3 g in dextrose 5 % 50 mL IVPB     3 g 160 mL/hr over 30 Minutes Intravenous To Neuro OR-Station #32 12/13/2012 1521 12/12/2012 1757      Assessment/Plan: s/p Procedure(s): OPEN REDUCTION INTERNAL FIXATION (ORIF) TIBIAL PLATEAU- left Has not passed swallowing evaluation.  May transfer to SDU  LOS: 9 days   Marta Lamas. Gae Bon, MD,  FACS [phone] Trauma Surgeon 12/10/2012
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
## 2012-12-11 ENCOUNTER — Encounter (HOSPITAL_COMMUNITY): Payer: Self-pay | Admitting: *Deleted

## 2012-12-11 DIAGNOSIS — I429 Cardiomyopathy, unspecified: Secondary | ICD-10-CM

## 2012-12-11 HISTORY — DX: Cardiomyopathy, unspecified: I42.9

## 2012-12-11 LAB — CULTURE, BLOOD (ROUTINE X 2)
Culture: NO GROWTH
Culture: NO GROWTH

## 2012-12-11 MED ORDER — BACLOFEN 20 MG PO TABS
20.0000 mg | ORAL_TABLET | Freq: Three times a day (TID) | ORAL | Status: DC
  Administered 2012-12-11 – 2012-12-15 (×13): 20 mg
  Filled 2012-12-11 (×16): qty 1

## 2012-12-11 MED ORDER — PANTOPRAZOLE SODIUM 40 MG PO PACK
40.0000 mg | PACK | Freq: Every day | ORAL | Status: DC
  Administered 2012-12-11 – 2012-12-15 (×5): 40 mg
  Filled 2012-12-11 (×7): qty 20

## 2012-12-11 MED ORDER — POTASSIUM CHLORIDE 2 MEQ/ML IV SOLN
INTRAVENOUS | Status: DC
  Administered 2012-12-11 – 2012-12-14 (×5): via INTRAVENOUS
  Filled 2012-12-11 (×9): qty 1000

## 2012-12-11 MED ORDER — ENOXAPARIN SODIUM 30 MG/0.3ML ~~LOC~~ SOLN
30.0000 mg | Freq: Two times a day (BID) | SUBCUTANEOUS | Status: DC
  Administered 2012-12-12 – 2012-12-16 (×9): 30 mg via SUBCUTANEOUS
  Filled 2012-12-11 (×10): qty 0.3

## 2012-12-11 MED ORDER — LISINOPRIL 2.5 MG PO TABS
2.5000 mg | ORAL_TABLET | Freq: Two times a day (BID) | ORAL | Status: DC
  Administered 2012-12-11 – 2012-12-12 (×4): 2.5 mg via ORAL
  Filled 2012-12-11 (×8): qty 1

## 2012-12-11 MED ORDER — PANTOPRAZOLE SODIUM 40 MG IV SOLR: 40.0000 mg | INTRAVENOUS | Status: DC
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
## 2012-12-11 NOTE — Progress Notes (Signed)
SLP Cancellation Note  Patient Details Name: Johnny Mathis MRN: [id number] DOB: September 30, 1960   Cancelled treatment:        Pt is currently insufficiently arousable for po trials.  Pt is nutritionally supported at this time.  Will continue efforts to assess po readiness. [name] B. [place], [facility], CCC-SLP [phone]   [name] 12/11/2012, 1:07 PM

## 2012-12-11 NOTE — Progress Notes (Signed)
Patient ID: Johnny Mathis, male   DOB: Aug 27, 1960, 52 y.o.   MRN: [id number]   SUBJECTIVE: Patient asleep, stable vitals.   Marland Kitchen antiseptic oral rinse  15 mL Mouth Rinse QID  . bacitracin   Topical BID  . baclofen  15 mg Per Tube TID  . carvedilol  6.25 mg Oral BID WC  . chlorhexidine  15 mL Mouth Rinse BID  . feeding supplement (PIVOT 1.5 CAL)  1,000 mL Per Tube Q24H  . feeding supplement (PRO-STAT SUGAR FREE 64)  30 mL Per Tube 5 X Daily  . fondaparinux (ARIXTRA) injection  2.5 mg Subcutaneous Q0600  . levETIRAcetam  500 mg Per Tube BID  . lisinopril  2.5 mg Oral BID  . pantoprazole (PROTONIX) IV  80 mg Intravenous Q12H  . selenium  200 mcg Per Tube Daily  . senna  1 tablet Oral BID  . vitamin C  1,000 mg Per Tube Q8H  . vitamin e  400 Units Per Tube Q8H      Filed Vitals:   12/11/12 0400 12/11/12 0500 12/11/12 0600 12/11/12 0700  BP: 130/56 127/58 126/61 116/54  Pulse: 68 66 70 60  Temp: 99.5 F (37.5 C) 99.5 F (37.5 C) 99.5 F (37.5 C) 99.5 F (37.5 C)  TempSrc: Core (Comment)   Core (Comment)  Resp: 19 20 16 21   Height:      Weight:  112 kg (246 lb 14.6 oz)    SpO2: 99% 99% 98% 96%    Intake/Output Summary (Last 24 hours) at 12/11/12 4098 Last data filed at 12/11/12 0600  Gross per 24 hour  Intake   2560 ml  Output   3400 ml  Net   -840 ml    LABS: Basic Metabolic Panel:  Recent Labs  11/91/47 0500 12/10/12 0500  NA 134* 135  K 4.2 4.1  CL 103 107  CO2 22 22  GLUCOSE 98 119*  BUN 20 18  CREATININE 0.79 0.72  CALCIUM 8.0* 7.9*   Liver Function Tests: No results found for this basename: AST, ALT, ALKPHOS, BILITOT, PROT, ALBUMIN,  in the last 72 hours No results found for this basename: LIPASE, AMYLASE,  in the last 72 hours CBC:  Recent Labs  12/09/12 0500 12/10/12 0500  WBC 11.9* 12.0*  HGB 9.3* 10.0*  HCT 27.1* 28.7*  MCV 91.6 91.7  PLT 157 295   Cardiac Enzymes: No results found for this basename: CKTOTAL, CKMB, CKMBINDEX, TROPONINI,   in the last 72 hours BNP: No components found with this basename: POCBNP,  D-Dimer: No results found for this basename: DDIMER,  in the last 72 hours Hemoglobin A1C: No results found for this basename: HGBA1C,  in the last 72 hours Fasting Lipid Panel: No results found for this basename: CHOL, HDL, LDLCALC, TRIG, CHOLHDL, LDLDIRECT,  in the last 72 hours Thyroid Function Tests: No results found for this basename: TSH, T4TOTAL, FREET3, T3FREE, THYROIDAB,  in the last 72 hours Anemia Panel: No results found for this basename: VITAMINB12, FOLATE, FERRITIN, TIBC, IRON, RETICCTPCT,  in the last 72 hours   PHYSICAL EXAM General: NAD Neck: c-collar in place, no thyromegaly or thyroid nodule.  Lungs: Clear to auscultation bilaterally with normal respiratory effort. CV: Nondisplaced PMI.  Heart regular S1/S2, no S3/S4, no murmur.  No peripheral edema.   Abdomen: Soft, nontender, no hepatosplenomegaly, no distention.  Neurologic: Sleeping Extremities: No clubbing or cyanosis.   TELEMETRY: Reviewed telemetry pt in NSR  ASSESSMENT AND PLAN: 1. PVCs: These  are minimal at this point, no further significant arrhythmias noted.  2. Cardiomyopathy: EF 30-35% with diffuse hypokinesis. It is possible that this is a stress cardiomyopathy due to sepsis. He is not volume overloaded on exam.  - continue Coreg.  - Add lisinopril 2.5 mg bid with BMET tomorrow. - After recovery, if no signs/symptoms of ischemia, would repeat echo as outpatient to see if EF has improved.   Marca Ancona 12/11/2012 8:14 AM

## 2012-12-11 NOTE — Progress Notes (Signed)
Patient ID: Johnny Mathis, male   DOB: 01/24/1960, 52 y.o.   MRN: [id number]   LOS: 10 days   Subjective: Sleepy but arouses easily. No c/o.   Objective: Vital signs in last 24 hours: Temp:  [98.8 F (37.1 C)-100.4 F (38 C)] 99.5 F (37.5 C) (12/11 0700) Pulse Rate:  [59-79] 60 (12/11 0700) Resp:  [14-29] 21 (12/11 0700) BP: (107-134)/(47-94) 116/54 mmHg (12/11 0700) SpO2:  [95 %-100 %] 96 % (12/11 0700) Arterial Line BP: (66-117)/(44-59) 66/59 mmHg (12/10 1200) Weight:  [246 lb 14.6 oz (112 kg)] 246 lb 14.6 oz (112 kg) (12/11 0500) Last BM Date: 12/10/12   UOP: 152ml/h NET: -771ml/24h TOTAL: +4776ml/admission   Physical Exam General appearance: alert and no distress Resp: rhonchi bilaterally and mild Cardio: regular rate and rhythm GI: normal findings: bowel sounds normal and soft, non-tender Neuro: Still significant spasticity BLE   Assessment/Plan: PHBC  SCI w/incomplete quadriplegia s/p ACDF -- willl increase baclofen Left tib/fib fx s/p ORIF -- NWB  Facial abrasions/lacs -- Local care  ID -- WBC still mildly elevated, fevers mostly abated. Vanc D7/7 for Enterococcus UTI. ABL anemia -- Stable  Cardiomyopathy -- per cards Hyponatremia -- Resolved FEN -- Decrease Protonix, lower IVF. VTE -- SCD's. Arixtra for prophylaxis while awaiting HIT panel.  Dispo -- Awaiting transfer to SDU    Freeman Caldron, PA-C Pager: [phone] General Trauma PA Pager: [phone]   12/11/2012

## 2012-12-11 NOTE — Progress Notes (Signed)
Overall unchanged. No new recommendations from my standpoint.

## 2012-12-11 NOTE — Progress Notes (Signed)
Follows some commands with LUE, just transferred to SDU. Panda for now P improvement in swallowing. Anticipate SNF. Patient examined and I agree with the assessment and plan  Violeta Gelinas, MD, MPH, FACS Pager: [phone]  12/11/2012 9:55 AM

## 2012-12-12 ENCOUNTER — Inpatient Hospital Stay (HOSPITAL_COMMUNITY): Payer: No Typology Code available for payment source

## 2012-12-12 DIAGNOSIS — D72829 Elevated white blood cell count, unspecified: Secondary | ICD-10-CM

## 2012-12-12 LAB — BLOOD GAS, ARTERIAL
Acid-base deficit: 0.7 mmol/L (ref 0.0–2.0)
Bicarbonate: 22.8 mEq/L (ref 20.0–24.0)
O2 Content: 2 L/min
O2 Saturation: 96.9 %
Patient temperature: 98.6
TCO2: 23.8 mmol/L (ref 0–100)

## 2012-12-12 LAB — CBC
HCT: 29.8 % — ABNORMAL LOW (ref 39.0–52.0)
Hemoglobin: 10 g/dL — ABNORMAL LOW (ref 13.0–17.0)
MCH: 31.8 pg (ref 26.0–34.0)
RBC: 3.14 MIL/uL — ABNORMAL LOW (ref 4.22–5.81)

## 2012-12-12 LAB — URINE MICROSCOPIC-ADD ON

## 2012-12-12 LAB — URINALYSIS, ROUTINE W REFLEX MICROSCOPIC
Glucose, UA: NEGATIVE mg/dL
Leukocytes, UA: NEGATIVE
pH: 5.5 (ref 5.0–8.0)

## 2012-12-12 LAB — BASIC METABOLIC PANEL
Chloride: 103 mEq/L (ref 96–112)
GFR calc Af Amer: >90 mL/min (ref >90)
GFR calc non Af Amer: >90 mL/min (ref >90)
Glucose, Bld: 115 mg/dL — ABNORMAL HIGH (ref 70–99)
Potassium: 4.5 mEq/L (ref 3.5–5.1)
Sodium: 134 mEq/L — ABNORMAL LOW (ref 135–145)

## 2012-12-12 MED ORDER — FREE WATER
200.0000 mL | Freq: Three times a day (TID) | Status: DC
  Administered 2012-12-12 – 2012-12-16 (×11): 200 mL

## 2012-12-12 NOTE — Progress Notes (Signed)
RT Note: Pt NTS'd and sample received and sent to lab per MD order. No complications noted BBS =, SPO2 99% on 2L [place]. Pt tolerated well RT will continue to monitor

## 2012-12-12 NOTE — Progress Notes (Signed)
UR completed.  Pt with temporary route for nutrition. Will need to pass swallow test or have PEG in place in order to discharge to SNF setting. Medical team is aware of this.  Carlyle Lipa, RN BSN MHA CCM Trauma/Neuro ICU Case Manager [phone]
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
## 2012-12-12 NOTE — Progress Notes (Signed)
Patient has some left basilar atelectasis.  Will send sputum culture through NT suctioning.  May need to start on empiric antibiotics.  ABG shows CO2 blow off.  Will continue to watch for now.  This patient has been seen and I agree with the findings and treatment plan.  Marta Lamas. Gae Bon, MD, FACS [phone] (pager) [phone] (direct pager) Trauma Surgeon

## 2012-12-12 NOTE — Progress Notes (Signed)
Subjective: 9 Days Post-Op Procedure(s) (LRB): OPEN REDUCTION INTERNAL FIXATION (ORIF) TIBIAL PLATEAU- left (Left) Patient reports pain as 0 on 0-10 scale.  Sleeping.  arousable  Objective: Vital signs in last 24 hours: Temp:  [99.3 F (37.4 C)-100.1 F (37.8 C)] 100.1 F (37.8 C) (12/12 0753) Pulse Rate:  [60-75] 75 (12/12 0753) Resp:  [19-26] 24 (12/12 0753) BP: (102-118)/(50-63) 104/54 mmHg (12/12 0753) SpO2:  [93 %-98 %] 93 % (12/12 0753) Weight:  [112.5 kg (248 lb 0.3 oz)] 112.5 kg (248 lb 0.3 oz) (12/12 0400)  Intake/Output from previous day: 12/11 0701 - 12/12 0700 In: 1895.8 [I.V.:975.8; NG/GT:920] Out: 3302 [Urine:3300; Stool:2] Intake/Output this shift: Total I/O In: -  Out: 200 [Urine:200]   Recent Labs  12/10/12 0500 12/12/12 0445  HGB 10.0* 10.0*    Recent Labs  12/10/12 0500 12/12/12 0445  WBC 12.0* 16.6*  RBC 3.13* 3.14*  HCT 28.7* 29.8*  PLT 295 515*    Recent Labs  12/10/12 0500 12/12/12 0445  NA 135 134*  K 4.1 4.5  CL 107 103  CO2 22 25  BUN 18 28*  CREATININE 0.72 0.78  GLUCOSE 119* 115*  CALCIUM 7.9* 8.2*   No results found for this basename: LABPT, INR,  in the last 72 hours  no change in neuro exam C5 central cord Tibial plateau incision looks good compartments soft. Will have extra padding added to heel .   I CALLED ORTHO TECH TO ADDRESS.  Assessment/Plan: 9 Days Post-Op Procedure(s) (LRB): OPEN REDUCTION INTERNAL FIXATION (ORIF) TIBIAL PLATEAU- left (Left)   PLAN:     LEAVE STAPLES IN FOR ONE MORE WEEK.  WILL NEED XRAYS IN 3 TO 4 WKS.   YATES,MARK C 12/12/2012, 10:03 AM

## 2012-12-12 NOTE — Progress Notes (Signed)
Patient ID: Johnny Mathis, male   DOB: 1960/09/10, 52 y.o.   MRN: [id number]   LOS: 11 days   Subjective: AMS  Objective: Vital signs in last 24 hours: Temp:  [99.3 F (37.4 C)-100.1 F (37.8 C)] 100.1 F (37.8 C) (12/12 0753) Pulse Rate:  [60-75] 75 (12/12 0753) Resp:  [19-26] 24 (12/12 0753) BP: (95-118)/(50-63) 104/54 mmHg (12/12 0753) SpO2:  [93 %-98 %] 93 % (12/12 0753) Weight:  [248 lb 0.3 oz (112.5 kg)] 248 lb 0.3 oz (112.5 kg) (12/12 0400) Last BM Date: 12/11/12   UOP: 150ml/h NET: -1463ml/24h TOTAL: +3317ml/admission   Laboratory CBC  Recent Labs  12/10/12 0500 12/12/12 0445  WBC 12.0* 16.6*  HGB 10.0* 10.0*  HCT 28.7* 29.8*  PLT 295 515*   BMET  Recent Labs  12/10/12 0500 12/12/12 0445  NA 135 134*  K 4.1 4.5  CL 107 103  CO2 22 25  GLUCOSE 119* 115*  BUN 18 28*  CREATININE 0.72 0.78  CALCIUM 7.9* 8.2*    Radiology CXR: Pending   Physical Exam General appearance: no distress Resp: rhonchi bilaterally Cardio: regular rate and rhythm GI: normal findings: bowel sounds normal and soft, non-tender Neuro: Would open eyes to pain, no FC, no verbal response. PERRL. Initially right eye wouldn't open but then stayed open after I checked pupils. Slow nystagmus initially but resolved after ~30s.   Assessment/Plan: PHBC  SCI w/incomplete quadriplegia s/p ACDF -- On baclofen for spasticity Left tib/fib fx s/p ORIF -- NWB  Facial abrasions/lacs -- Local care  ID -- WBC increased today, low-grade fevers. Vanc ended yesterday. Recheck urine and blood cultures, CXR. Check ABG. ABL anemia -- Stable  Cardiomyopathy -- per cards  FEN -- As above. VTE -- SCD's, Lovenox Dispo -- SNF eventually, worried patient may be heading toward sepsis again.    Freeman Caldron, PA-C Pager: [phone] General Trauma PA Pager: [phone]   12/12/2012

## 2012-12-12 NOTE — Clinical Social Work Note (Signed)
Clinical Social Worker continuing to follow patient for support and discharge planning needs.  CSW attempted to engage with patient at bedside, however patient remains unresponsive at this time.  [facility] has extended a bed offer to patient once medically stable.  CSW will further explore decision making and signing patient into facility next week pending patient improvement with mental status.  CSW remains available for support and to facilitate patient discharge needs once medically ready.  [name], [place] [phone]

## 2012-12-13 ENCOUNTER — Inpatient Hospital Stay (HOSPITAL_COMMUNITY): Payer: No Typology Code available for payment source

## 2012-12-13 DIAGNOSIS — S14121A Central cord syndrome at C1 level of cervical spinal cord, initial encounter: Secondary | ICD-10-CM

## 2012-12-13 DIAGNOSIS — S069X9A Unspecified intracranial injury with loss of consciousness of unspecified duration, initial encounter: Secondary | ICD-10-CM

## 2012-12-13 LAB — BASIC METABOLIC PANEL
BUN: 37 mg/dL — ABNORMAL HIGH (ref 6–23)
Calcium: 8 mg/dL — ABNORMAL LOW (ref 8.4–10.5)
Creatinine, Ser: 0.75 mg/dL (ref 0.50–1.35)
GFR calc Af Amer: >90 mL/min (ref >90)
Potassium: 4.5 mEq/L (ref 3.5–5.1)

## 2012-12-13 LAB — CBC WITH DIFFERENTIAL/PLATELET
Basophils Absolute: 0 10*3/uL (ref 0.0–0.1)
Basophils Relative: 0 % (ref 0–1)
Eosinophils Relative: 0 % (ref 0–5)
HCT: 29.8 % — ABNORMAL LOW (ref 39.0–52.0)
Lymphocytes Relative: 8 % — ABNORMAL LOW (ref 12–46)
MCHC: 32.6 g/dL (ref 30.0–36.0)
MCV: 95.5 fL (ref 78.0–100.0)
Monocytes Absolute: 1.2 10*3/uL — ABNORMAL HIGH (ref 0.1–1.0)
Monocytes Relative: 8 % (ref 3–12)
Neutro Abs: 11.8 10*3/uL — ABNORMAL HIGH (ref 1.7–7.7)
Platelets: 560 10*3/uL — ABNORMAL HIGH (ref 150–400)
RDW: 15.1 % (ref 11.5–15.5)
WBC: 14.2 10*3/uL — ABNORMAL HIGH (ref 4.0–10.5)

## 2012-12-13 LAB — URINE CULTURE: Culture: NO GROWTH

## 2012-12-13 MED ORDER — LISINOPRIL 2.5 MG PO TABS
2.5000 mg | ORAL_TABLET | Freq: Two times a day (BID) | ORAL | Status: DC
  Administered 2012-12-14 – 2012-12-15 (×4): 2.5 mg via ORAL
  Filled 2012-12-13 (×8): qty 1

## 2012-12-13 NOTE — Progress Notes (Signed)
Trauma Service Note  Subjective: Patient mumbled a word or two this AM.  Still difficult to awaken and be alert.  Received 50 mcg of fentanyl about 10:20 last night as the only sedation..    Objective: Vital signs in last 24 hours: Temp:  [99 F (37.2 C)-101.3 F (38.5 C)] 99 F (37.2 C) (12/13 0741) Pulse Rate:  [67-76] 73 (12/13 0741) Resp:  [17-32] 21 (12/13 0741) BP: (97-120)/(42-72) 110/50 mmHg (12/13 0741) SpO2:  [93 %-100 %] 96 % (12/13 0741) Weight:  [110.4 kg (243 lb 6.2 oz)] 110.4 kg (243 lb 6.2 oz) (12/13 0403) Last BM Date: 12/12/12  Intake/Output from previous day: 12/12 0701 - 12/13 0700 In: 2510 [P.O.:240; I.V.:1150; NG/GT:1120] Out: 1700 [Urine:1700] Intake/Output this shift:    General: No distress.  Respiratory rate is good.  Lungs: Coughs well.  Oxygen saturation 98% on 2L.  No CXR done today.    Abd: Soft, good bowel sounds. Tolerating tube feedings well.  Probably need to start thinking about PEG since patient has not been able to pass swallowing evaluation.  Extremities: No changes  Neuro: Arousable, but not following commands.  Still has significant weakness in upper and lower extremities.  Lab Results: CBC   Recent Labs  12/12/12 0445 12/13/12 0450  WBC 16.6* 14.2*  HGB 10.0* 9.7*  HCT 29.8* 29.8*  PLT 515* 560*   BMET  Recent Labs  12/12/12 0445 12/13/12 0450  NA 134* 135  K 4.5 4.5  CL 103 104  CO2 25 25  GLUCOSE 115* 105*  BUN 28* 37*  CREATININE 0.78 0.75  CALCIUM 8.2* 8.0*   PT/INR No results found for this basename: LABPROT, INR,  in the last 72 hours ABG  Recent Labs  12/12/12 0955  PHART 7.451*  HCO3 22.8    Studies/Results: Ct Head Wo Contrast  12/12/2012   CLINICAL DATA:  Altered mental status.  Recent trauma.  EXAM: CT HEAD WITHOUT CONTRAST  TECHNIQUE: Contiguous axial images were obtained from the base of the skull through the vertex without contrast.  COMPARISON:  12/05/2012  FINDINGS: No evidence for  acute infarction, hemorrhage, mass lesion, hydrocephalus, or extra-axial fluid. Slight premature atrophy is suspected. No definite white matter disease. Calvarium intact. Chronic sinus disease.  Compared with priors, there is little change. ET tube appears to have been removed.  IMPRESSION: Stable exam.  No acute intracranial abnormality.   Electronically Signed   By: Davonna Belling M.D.   On: 12/12/2012 17:35   Dg Chest Port 1 View  12/12/2012   CLINICAL DATA:  Rhonchi, leukocytosis  EXAM: PORTABLE CHEST - 1 VIEW  COMPARISON:  12/10/2012; 12/09/2012; 12/05/2012; chest CT -12/27/2012  FINDINGS: Grossly unchanged borderline enlarged cardiac silhouette and mediastinal contours given patient rotation. Stable positioning of support apparatus. Lung volumes remain reduced with slight worsening of left basilar/retrocardiac heterogeneous opacities. No definite pleural effusion or pneumothorax. No definite evidence of edema. Unchanged bones including lower cervical ACDF, incompletely evaluated.  IMPRESSION: Persistently reduced lung volumes with worse left basilar/ retrocardiac opacities, atelectasis versus infiltrate. Further evaluation with a PA and lateral chest radiograph may be obtained as clinically indicated.   Electronically Signed   By: Simonne Come M.D.   On: 12/12/2012 10:08    Anti-infectives: Anti-infectives   Start     Dose/Rate Route Frequency Ordered Stop   12/08/12 1600  vancomycin (VANCOCIN) IVPB 750 mg/150 ml premix     750 mg 150 mL/hr over 60 Minutes Intravenous Every 8 hours 12/08/12  1318 12/10/12 0852   12/05/12 2000  vancomycin (VANCOCIN) IVPB 750 mg/150 ml premix  Status:  Discontinued     750 mg 150 mL/hr over 60 Minutes Intravenous Every 12 hours 12/05/12 1011 12/08/12 1318   12/05/12 1200  piperacillin-tazobactam (ZOSYN) IVPB 3.375 g  Status:  Discontinued     3.375 g 12.5 mL/hr over 240 Minutes Intravenous 3 times per day 12/05/12 0821 12/09/12 0850   12/05/12 0800  vancomycin  (VANCOCIN) IVPB 1000 mg/200 mL premix  Status:  Discontinued     1,000 mg 200 mL/hr over 60 Minutes Intravenous Every 8 hours 12/05/12 0227 12/05/12 1011   12/05/12 0230  piperacillin-tazobactam (ZOSYN) IVPB 3.375 g  Status:  Discontinued     3.375 g 12.5 mL/hr over 240 Minutes Intravenous 3 times per day 12/05/12 0218 12/05/12 0821   12/05/12 0230  vancomycin (VANCOCIN) IVPB 1000 mg/200 mL premix     1,000 mg 200 mL/hr over 60 Minutes Intravenous  Once 12/05/12 0227 12/05/12 0403   2012/12/07 1700  ceFAZolin (ANCEF) IVPB 2 g/50 mL premix     2 g 100 mL/hr over 30 Minutes Intravenous  Once 07-Dec-2012 1651 12/07/12 1643   Dec 07, 2012 1300  ciprofloxacin (CIPRO) tablet 500 mg  Status:  Discontinued     500 mg Oral 2 times daily 12/07/2012 1202 12/05/12 0229   December 07, 2012 0000  ceFAZolin (ANCEF) IVPB 2 g/50 mL premix     2 g 100 mL/hr over 30 Minutes Intravenous  Once 12/02/12 1346 12/07/12 0009   12/02/12 0200  ceFAZolin (ANCEF) IVPB 1 g/50 mL premix     1 g 100 mL/hr over 30 Minutes Intravenous Every 8 hours 12/04/2012 2134 12/02/12 1026   12/03/2012 1812  bacitracin 50,000 Units in sodium chloride irrigation 0.9 % 500 mL irrigation  Status:  Discontinued       As needed 12/02/2012 1828 12/14/2012 2000   12/23/2012 1530  ceFAZolin (ANCEF) 3 g in dextrose 5 % 50 mL IVPB     3 g 160 mL/hr over 30 Minutes Intravenous To Neuro OR-Station #32 12/22/2012 1521 12/26/2012 1757      Assessment/Plan: s/p Procedure(s): OPEN REDUCTION INTERNAL FIXATION (ORIF) TIBIAL PLATEAU- left Continue foley due to strict I&O and neurological deficits and patient cannot control voiding  Check CXR tomorrow and labs.  LOS: 12 days   Marta Lamas. Gae Bon, MD, FACS [phone] Trauma Surgeon 12/13/2012

## 2012-12-13 NOTE — Progress Notes (Addendum)
1758:  Reassessment, pt again has increased work of breathing, RR ranging in high 20's, oxygen saturations >98.  Portable Chest X Ray completed.  Nurse will carry out orders once written.  Nurse will continue to monitor.  1715:  Post nasal suctioning by respiratory, pt is resting with RR ranging low to mid 20's, decreased work of breathing, diminished rhonchus breath sounds .  Nurse will continue to monitor   1700:Nurse assessed pt during 1600 hour to obtain vitals and noticed pt increased work of breathing, RR ranging high 20's-low 30's, Oxygen saturations >98 sustained, absent breath sounds (previously rhonchus).  Pt remains obtunded from last assessment.  Nurse contacted MD to inform of status, MD instructed nurse to order nasal suctioning to be performed by respiratory as needed, STAT portable CXR.  Respiratory is at bedside.  Nurse will continue to monitor.

## 2012-12-13 NOTE — Progress Notes (Signed)
Nurse contacted MD to inform that pt mental status had changed since am.  Pt is no longer responsive, pt is making a snoring sound while eyes are open.  MD acknowledged nurse concerns and informed nurse that pt has experienced episodes such as this in the past.  No additional interventions are ordered and nurse will continue to monitor.

## 2012-12-14 ENCOUNTER — Inpatient Hospital Stay (HOSPITAL_COMMUNITY): Payer: No Typology Code available for payment source

## 2012-12-14 LAB — URINALYSIS, ROUTINE W REFLEX MICROSCOPIC
Glucose, UA: NEGATIVE mg/dL
Ketones, ur: NEGATIVE mg/dL
Leukocytes, UA: NEGATIVE
Nitrite: NEGATIVE
Specific Gravity, Urine: 1.029 (ref 1.005–1.030)
Urobilinogen, UA: 1 mg/dL (ref 0.0–1.0)
pH: 5 (ref 5.0–8.0)

## 2012-12-14 LAB — BASIC METABOLIC PANEL
CO2: 25 mEq/L (ref 19–32)
Chloride: 101 mEq/L (ref 96–112)
Creatinine, Ser: 0.74 mg/dL (ref 0.50–1.35)
GFR calc Af Amer: >90 mL/min (ref >90)
GFR calc non Af Amer: >90 mL/min (ref >90)
Potassium: 4.7 mEq/L (ref 3.5–5.1)
Sodium: 132 mEq/L — ABNORMAL LOW (ref 135–145)

## 2012-12-14 LAB — CBC WITH DIFFERENTIAL/PLATELET
Basophils Absolute: 0 10*3/uL (ref 0.0–0.1)
Basophils Relative: 0 % (ref 0–1)
HCT: 30 % — ABNORMAL LOW (ref 39.0–52.0)
Lymphocytes Relative: 7 % — ABNORMAL LOW (ref 12–46)
MCHC: 33 g/dL (ref 30.0–36.0)
Neutro Abs: 12.1 10*3/uL — ABNORMAL HIGH (ref 1.7–7.7)
Neutrophils Relative %: 85 % — ABNORMAL HIGH (ref 43–77)
RBC: 3.14 MIL/uL — ABNORMAL LOW (ref 4.22–5.81)
RDW: 14.6 % (ref 11.5–15.5)
WBC: 14.3 10*3/uL — ABNORMAL HIGH (ref 4.0–10.5)

## 2012-12-14 NOTE — Progress Notes (Signed)
Core temp = 103 F.  Dr. Gwinda Passe notified.  See NO.  Ice packs applied to groin and axillary regions.  Tylenol given via PANDA.  Will cont. To monitor.

## 2012-12-14 NOTE — Progress Notes (Signed)
11 Days Post-Op  Subjective: Eyes open - snoring; not responding to commands Minimal sedation  Objective: Vital signs in last 24 hours: Temp:  [98.9 F (37.2 C)-100.6 F (38.1 C)] 100.1 F (37.8 C) (12/14 0409) Pulse Rate:  [62-81] 76 (12/14 0700) Resp:  [18-30] 30 (12/14 0700) BP: (102-126)/(46-66) 116/66 mmHg (12/14 0700) SpO2:  [94 %-100 %] 99 % (12/14 0700) Weight:  [237 lb 10.5 oz (107.8 kg)] 237 lb 10.5 oz (107.8 kg) (12/14 0409) Last BM Date: 12/12/12  Intake/Output from previous day: 12/13 0701 - 12/14 0700 In: 2547.5 [I.V.:1057.5; NG/GT:1490] Out: 2780 [Urine:2780] Intake/Output this shift:    General appearance: no distress GI: soft, +BS; on tube feeds  Lab Results:   Recent Labs  12/13/12 0450 12/14/12 0450  WBC 14.2* 14.3*  HGB 9.7* 9.9*  HCT 29.8* 30.0*  PLT 560* 552*   BMET  Recent Labs  12/13/12 0450 12/14/12 0450  NA 135 132*  K 4.5 4.7  CL 104 101  CO2 25 25  GLUCOSE 105* 111*  BUN 37* 31*  CREATININE 0.75 0.74  CALCIUM 8.0* 8.1*   PT/INR No results found for this basename: LABPROT, INR,  in the last 72 hours ABG  Recent Labs  12/12/12 0955  PHART 7.451*  HCO3 22.8    Studies/Results: Ct Head Wo Contrast  12/12/2012   CLINICAL DATA:  Altered mental status.  Recent trauma.  EXAM: CT HEAD WITHOUT CONTRAST  TECHNIQUE: Contiguous axial images were obtained from the base of the skull through the vertex without contrast.  COMPARISON:  12/05/2012  FINDINGS: No evidence for acute infarction, hemorrhage, mass lesion, hydrocephalus, or extra-axial fluid. Slight premature atrophy is suspected. No definite white matter disease. Calvarium intact. Chronic sinus disease.  Compared with priors, there is little change. ET tube appears to have been removed.  IMPRESSION: Stable exam.  No acute intracranial abnormality.   Electronically Signed   By: Davonna Belling M.D.   On: 12/12/2012 17:35   Dg Chest Port 1 View  12/14/2012   CLINICAL DATA:   Atelectasis  EXAM: PORTABLE CHEST - 1 VIEW  COMPARISON:  12/13/2012  FINDINGS: Cardiac shadow is stable. The lungs are clear bilaterally. A feeding catheter is noted within the stomach. A left central venous line is seen within the proximal superior vena cava.  IMPRESSION: No active disease.   Electronically Signed   By: Alcide Clever M.D.   On: 12/14/2012 08:10   Dg Chest Port 1 View  12/13/2012   CLINICAL DATA:  Respiratory status change. Followup left basilar atelectasis and/or pneumonia.  EXAM: PORTABLE CHEST - 1 VIEW  COMPARISON:  Portable examinations yesterday dating back to 12/08/2012.  FINDINGS: Cardiac silhouette moderately enlarged but stable. Pulmonary vascularity normal. Interval resolution of the opacities at the left lung base. Lungs now clear. Feeding tube courses below the diaphragm though its tip is not included on the examination.  IMPRESSION: Resolution of left basilar atelectasis and/or pneumonia since yesterday. No acute cardiopulmonary disease currently. Cardiomegaly without pulmonary edema.   Electronically Signed   By: Hulan Saas M.D.   On: 12/13/2012 18:05   Dg Chest Port 1 View  12/12/2012   CLINICAL DATA:  Rhonchi, leukocytosis  EXAM: PORTABLE CHEST - 1 VIEW  COMPARISON:  12/10/2012; 12/09/2012; December 18, 2012; chest CT -12-18-2012  FINDINGS: Grossly unchanged borderline enlarged cardiac silhouette and mediastinal contours given patient rotation. Stable positioning of support apparatus. Lung volumes remain reduced with slight worsening of left basilar/retrocardiac heterogeneous opacities. No definite pleural effusion  or pneumothorax. No definite evidence of edema. Unchanged bones including lower cervical ACDF, incompletely evaluated.  IMPRESSION: Persistently reduced lung volumes with worse left basilar/ retrocardiac opacities, atelectasis versus infiltrate. Further evaluation with a PA and lateral chest radiograph may be obtained as clinically indicated.   Electronically Signed    By: Simonne Come M.D.   On: 12/12/2012 10:08    Anti-infectives: Anti-infectives   Start     Dose/Rate Route Frequency Ordered Stop   12/08/12 1600  vancomycin (VANCOCIN) IVPB 750 mg/150 ml premix     750 mg 150 mL/hr over 60 Minutes Intravenous Every 8 hours 12/08/12 1318 12/10/12 0852   12/05/12 2000  vancomycin (VANCOCIN) IVPB 750 mg/150 ml premix  Status:  Discontinued     750 mg 150 mL/hr over 60 Minutes Intravenous Every 12 hours 12/05/12 1011 12/08/12 1318   12/05/12 1200  piperacillin-tazobactam (ZOSYN) IVPB 3.375 g  Status:  Discontinued     3.375 g 12.5 mL/hr over 240 Minutes Intravenous 3 times per day 12/05/12 0821 12/09/12 0850   12/05/12 0800  vancomycin (VANCOCIN) IVPB 1000 mg/200 mL premix  Status:  Discontinued     1,000 mg 200 mL/hr over 60 Minutes Intravenous Every 8 hours 12/05/12 0227 12/05/12 1011   12/05/12 0230  piperacillin-tazobactam (ZOSYN) IVPB 3.375 g  Status:  Discontinued     3.375 g 12.5 mL/hr over 240 Minutes Intravenous 3 times per day 12/05/12 0218 12/05/12 0821   12/05/12 0230  vancomycin (VANCOCIN) IVPB 1000 mg/200 mL premix     1,000 mg 200 mL/hr over 60 Minutes Intravenous  Once 12/05/12 0227 12/05/12 0403   12/25/2012 1700  ceFAZolin (ANCEF) IVPB 2 g/50 mL premix     2 g 100 mL/hr over 30 Minutes Intravenous  Once Dec 25, 2012 1651 December 25, 2012 1643   December 25, 2012 1300  ciprofloxacin (CIPRO) tablet 500 mg  Status:  Discontinued     500 mg Oral 2 times daily 2012/12/25 1202 12/05/12 0229   12-25-2012 0000  ceFAZolin (ANCEF) IVPB 2 g/50 mL premix     2 g 100 mL/hr over 30 Minutes Intravenous  Once 12/02/12 1346 12-25-2012 0009   12/02/12 0200  ceFAZolin (ANCEF) IVPB 1 g/50 mL premix     1 g 100 mL/hr over 30 Minutes Intravenous Every 8 hours 12/12/2012 2134 12/02/12 1026   12/05/2012 1812  bacitracin 50,000 Units in sodium chloride irrigation 0.9 % 500 mL irrigation  Status:  Discontinued       As needed 12/07/2012 1828 12/11/2012 2000   12/11/2012 1530  ceFAZolin (ANCEF)  3 g in dextrose 5 % 50 mL IVPB     3 g 160 mL/hr over 30 Minutes Intravenous To Neuro OR-Station #32 12/16/2012 1521 12/09/2012 1757      Assessment/Plan: s/p Procedure(s): OPEN REDUCTION INTERNAL FIXATION (ORIF) TIBIAL PLATEAU- left (Left) Trauma team considering PEG due to swallowing issues Continue foley for strict I&O and neuro deficits Encephalopathy secondary to sepsis/ fever - no obvious findings on CT scan; consider MRI brain  LOS: 13 days    Johnny Paredez K. 12/14/2012

## 2012-12-15 ENCOUNTER — Inpatient Hospital Stay (HOSPITAL_COMMUNITY): Payer: No Typology Code available for payment source

## 2012-12-15 DIAGNOSIS — R509 Fever, unspecified: Secondary | ICD-10-CM

## 2012-12-15 DIAGNOSIS — R4182 Altered mental status, unspecified: Secondary | ICD-10-CM

## 2012-12-15 DIAGNOSIS — J189 Pneumonia, unspecified organism: Secondary | ICD-10-CM

## 2012-12-15 DIAGNOSIS — G9340 Encephalopathy, unspecified: Secondary | ICD-10-CM

## 2012-12-15 LAB — CLOSTRIDIUM DIFFICILE BY PCR: Toxigenic C. Difficile by PCR: NEGATIVE

## 2012-12-15 LAB — CULTURE, RESPIRATORY W GRAM STAIN: Special Requests: NORMAL

## 2012-12-15 LAB — CULTURE, RESPIRATORY

## 2012-12-15 MED ORDER — BACLOFEN 10 MG PO TABS
10.0000 mg | ORAL_TABLET | Freq: Three times a day (TID) | ORAL | Status: DC
  Administered 2012-12-15 – 2012-12-16 (×3): 10 mg
  Filled 2012-12-15 (×5): qty 1

## 2012-12-15 MED ORDER — PIPERACILLIN-TAZOBACTAM 3.375 G IVPB
3.3750 g | Freq: Three times a day (TID) | INTRAVENOUS | Status: DC
  Administered 2012-12-15 – 2012-12-16 (×4): 3.375 g via INTRAVENOUS
  Filled 2012-12-15 (×6): qty 50

## 2012-12-15 MED ORDER — IBUPROFEN 800 MG PO TABS
800.0000 mg | ORAL_TABLET | Freq: Once | ORAL | Status: AC
  Administered 2012-12-15: 800 mg via ORAL
  Filled 2012-12-15 (×2): qty 1
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
## 2012-12-15 NOTE — Progress Notes (Signed)
EEG Completed; Results Pending  
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
## 2012-12-15 NOTE — Progress Notes (Signed)
It is suspected that patient may be aspirating tube feeds. Pharmacist mixed 5 mL of Methylene blue in with continuous tube feeding (Pivot 1.5).  Infusing at 40 mL/hr, goal rate. Will continue to monitor patient and provide update to Dr Lindie Spruce regarding appearance of nasotracheal suctioning at next occurrence.

## 2012-12-15 NOTE — Progress Notes (Signed)
Trauma Service Note  Subjective: The patient will track you, but he will not respond or follow commands.  Objective: Vital signs in last 24 hours: Temp:  [101 F (38.3 C)-103 F (39.4 C)] 102.8 F (39.3 C) (12/15 0408) Pulse Rate:  [79-95] 90 (12/15 0408) Resp:  [24-45] 45 (12/15 0408) BP: (104-142)/(50-72) 126/62 mmHg (12/15 0408) SpO2:  [89 %-98 %] 93 % (12/15 0408) Weight:  [105.1 kg (231 lb 11.3 oz)] 105.1 kg (231 lb 11.3 oz) (12/15 0408) Last BM Date: 12/14/12  Intake/Output from previous day: 12/14 0701 - 12/15 0700 In: 3210 [I.V.:950; NG/GT:1210] Out: 1500 [Urine:1500] Intake/Output this shift:    General: High fevers.  Just ordered Zosyn for possible PNA.  No acute distress although his RR is high at 39  Lungs: Coarse breath sounds, even with nurse nasotracheally suctioning the patient frequently.  Lots of secretions.  She thought he may have been aspirating, but residuals are okay, and having diarrhea.  Oxygen saturations are marginal on 4L by  at 93%.  CXR from yesterday was without focal infiltrate.  Tracheal aspirate from 12/12 is growing Pseudomonas aeruginosa.  Abd: Soft, good bowel sounds.   Having diarrhea with Flexiseal in place.  Extremities: No changes  Neuro: No improvement.  Lab Results: CBC   Recent Labs  12/13/12 0450 12/14/12 0450  WBC 14.2* 14.3*  HGB 9.7* 9.9*  HCT 29.8* 30.0*  PLT 560* 552*   BMET  Recent Labs  12/13/12 0450 12/14/12 0450  NA 135 132*  K 4.5 4.7  CL 104 101  CO2 25 25  GLUCOSE 105* 111*  BUN 37* 31*  CREATININE 0.75 0.74  CALCIUM 8.0* 8.1*   PT/INR No results found for this basename: LABPROT, INR,  in the last 72 hours ABG  Recent Labs  12/12/12 0955  PHART 7.451*  HCO3 22.8    Studies/Results: Dg Chest Port 1 View  12/14/2012   CLINICAL DATA:  Atelectasis  EXAM: PORTABLE CHEST - 1 VIEW  COMPARISON:  12/13/2012  FINDINGS: Cardiac shadow is stable. The lungs are clear bilaterally. A feeding  catheter is noted within the stomach. A left central venous line is seen within the proximal superior vena cava.  IMPRESSION: No active disease.   Electronically Signed   By: Alcide Clever M.D.   On: 12/14/2012 08:10   Dg Chest Port 1 View  12/13/2012   CLINICAL DATA:  Respiratory status change. Followup left basilar atelectasis and/or pneumonia.  EXAM: PORTABLE CHEST - 1 VIEW  COMPARISON:  Portable examinations yesterday dating back to 12/08/2012.  FINDINGS: Cardiac silhouette moderately enlarged but stable. Pulmonary vascularity normal. Interval resolution of the opacities at the left lung base. Lungs now clear. Feeding tube courses below the diaphragm though its tip is not included on the examination.  IMPRESSION: Resolution of left basilar atelectasis and/or pneumonia since yesterday. No acute cardiopulmonary disease currently. Cardiomegaly without pulmonary edema.   Electronically Signed   By: Hulan Saas M.D.   On: 12/13/2012 18:05    Anti-infectives: Anti-infectives   Start     Dose/Rate Route Frequency Ordered Stop   12/15/12 0800  piperacillin-tazobactam (ZOSYN) IVPB 3.375 g     3.375 g 12.5 mL/hr over 240 Minutes Intravenous 3 times per day 12/15/12 0722     12/08/12 1600  vancomycin (VANCOCIN) IVPB 750 mg/150 ml premix     750 mg 150 mL/hr over 60 Minutes Intravenous Every 8 hours 12/08/12 1318 12/10/12 0852   12/05/12 2000  vancomycin (VANCOCIN) IVPB  750 mg/150 ml premix  Status:  Discontinued     750 mg 150 mL/hr over 60 Minutes Intravenous Every 12 hours 12/05/12 1011 12/08/12 1318   12/05/12 1200  piperacillin-tazobactam (ZOSYN) IVPB 3.375 g  Status:  Discontinued     3.375 g 12.5 mL/hr over 240 Minutes Intravenous 3 times per day 12/05/12 0821 12/09/12 0850   12/05/12 0800  vancomycin (VANCOCIN) IVPB 1000 mg/200 mL premix  Status:  Discontinued     1,000 mg 200 mL/hr over 60 Minutes Intravenous Every 8 hours 12/05/12 0227 12/05/12 1011   12/05/12 0230   piperacillin-tazobactam (ZOSYN) IVPB 3.375 g  Status:  Discontinued     3.375 g 12.5 mL/hr over 240 Minutes Intravenous 3 times per day 12/05/12 0218 12/05/12 0821   12/05/12 0230  vancomycin (VANCOCIN) IVPB 1000 mg/200 mL premix     1,000 mg 200 mL/hr over 60 Minutes Intravenous  Once 12/05/12 0227 12/05/12 0403   Dec 15, 2012 1700  ceFAZolin (ANCEF) IVPB 2 g/50 mL premix     2 g 100 mL/hr over 30 Minutes Intravenous  Once 12-15-2012 1651 December 15, 2012 1643   December 15, 2012 1300  ciprofloxacin (CIPRO) tablet 500 mg  Status:  Discontinued     500 mg Oral 2 times daily 2012/12/15 1202 12/05/12 0229   12-15-2012 0000  ceFAZolin (ANCEF) IVPB 2 g/50 mL premix     2 g 100 mL/hr over 30 Minutes Intravenous  Once 12/02/12 1346 15-Dec-2012 0009   12/02/12 0200  ceFAZolin (ANCEF) IVPB 1 g/50 mL premix     1 g 100 mL/hr over 30 Minutes Intravenous Every 8 hours 12/03/2012 2134 12/02/12 1026   12/18/2012 1812  bacitracin 50,000 Units in sodium chloride irrigation 0.9 % 500 mL irrigation  Status:  Discontinued       As needed 12/21/2012 1828 12/07/2012 2000   12/12/2012 1530  ceFAZolin (ANCEF) 3 g in dextrose 5 % 50 mL IVPB     3 g 160 mL/hr over 30 Minutes Intravenous To Neuro OR-Station #32 12/15/2012 1521 12/20/2012 1757      Assessment/Plan: s/p Procedure(s): OPEN REDUCTION INTERNAL FIXATION (ORIF) TIBIAL PLATEAU- left CXR this AM Start Zosyn for PNA Await C.diff titer Repeat labs in the AM  LOS: 14 days   Marta Lamas. Gae Bon, MD, FACS [phone] Trauma Surgeon 12/15/2012
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
## 2012-12-16 ENCOUNTER — Telehealth (HOSPITAL_COMMUNITY): Payer: Self-pay | Admitting: Emergency Medicine

## 2012-12-16 ENCOUNTER — Inpatient Hospital Stay (HOSPITAL_COMMUNITY): Payer: No Typology Code available for payment source

## 2012-12-16 LAB — BASIC METABOLIC PANEL
BUN: 40 mg/dL — ABNORMAL HIGH (ref 6–23)
CO2: 26 mEq/L (ref 19–32)
Chloride: 104 mEq/L (ref 96–112)
Creatinine, Ser: 0.76 mg/dL (ref 0.50–1.35)
GFR calc Af Amer: >90 mL/min (ref >90)
GFR calc non Af Amer: >90 mL/min (ref >90)
Glucose, Bld: 111 mg/dL — ABNORMAL HIGH (ref 70–99)
Potassium: 3.7 mEq/L (ref 3.5–5.1)
Sodium: 136 mEq/L (ref 135–145)

## 2012-12-16 LAB — CBC WITH DIFFERENTIAL/PLATELET
Basophils Relative: 0 % (ref 0–1)
Eosinophils Absolute: 0 10*3/uL (ref 0.0–0.7)
Lymphs Abs: 0.9 10*3/uL (ref 0.7–4.0)
MCH: 31.1 pg (ref 26.0–34.0)
Neutro Abs: 20.9 10*3/uL — ABNORMAL HIGH (ref 1.7–7.7)
Neutrophils Relative %: 88 % — ABNORMAL HIGH (ref 43–77)
Platelets: 479 10*3/uL — ABNORMAL HIGH (ref 150–400)
RBC: 2.96 MIL/uL — ABNORMAL LOW (ref 4.22–5.81)

## 2012-12-16 MED ORDER — BACLOFEN 5 MG HALF TABLET
5.0000 mg | ORAL_TABLET | Freq: Three times a day (TID) | ORAL | Status: DC
  Filled 2012-12-16 (×2): qty 1

## 2012-12-16 MED ORDER — SODIUM CHLORIDE 0.9 % IV SOLN
1.0000 mg/h | INTRAVENOUS | Status: DC
  Administered 2012-12-16: 1 mg/h via INTRAVENOUS
  Administered 2012-12-17: 2 mg/h via INTRAVENOUS
  Filled 2012-12-16: qty 10

## 2012-12-16 NOTE — Progress Notes (Signed)
Nutrition Brief Note  Chart reviewed. Pt now transitioning to comfort care.  No further nutrition interventions warranted at this time.  Please re-consult as needed.   Aryeh Butterfield MS, RD, LDN Pager: [phone] After-hours pager: [phone]    

## 2012-12-16 NOTE — Progress Notes (Signed)
Pt nasotracheal suctioned per MD order. Pt has decent cough and a large amount of thick pale yellow secretions was obtained. No blue dye noted. Pt tolerated well with no complications noted. RT will monitor.

## 2012-12-16 NOTE — Progress Notes (Signed)
UR completed.  Kaylyne Axton, RN BSN M[facility] CCM Trauma/Neuro ICU Case Manager [phone]  
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
## 2012-12-16 NOTE — Telephone Encounter (Signed)
I spoke with pt's mother this morning, see other note.
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
## 2012-12-16 NOTE — Progress Notes (Addendum)
Trauma Service Note  Subjective: Patient is no more alert this AM, but not breathing as heavily.  No reports of aspiration.  Blue dye was used in one bottle.  Objective: Vital signs in last 24 hours: Temp:  [98.6 F (37 C)-104.4 F (40.2 C)] 98.6 F (37 C) (12/16 0442) Pulse Rate:  [71-93] 71 (12/16 0442) Resp:  [24-39] 24 (12/16 0442) BP: (109-135)/(44-61) 109/54 mmHg (12/16 0442) SpO2:  [94 %-100 %] 100 % (12/16 0442) Weight:  [105 kg (231 lb 7.7 oz)] 105 kg (231 lb 7.7 oz) (12/16 0442) Last BM Date: 12/14/12  Intake/Output from previous day: 12/15 0701 - 12/16 0700 In: 2980 [I.V.:1350; NG/GT:1480; IV Piggyback:150] Out: 2240 [Urine:2240] Intake/Output this shift:    General: No distress.  HR 54, BP good.   Lungs: Coarse bilaterally again today, but L>R.  CXR is pending.  Abd: Soft, tolerating tube feedings well.  Extremities: No changes  Neuro: No changes, will not do much at all  Lab Results: CBC   Recent Labs  12/14/12 0450 12/16/12 0400  WBC 14.3* 23.7*  HGB 9.9* 9.2*  HCT 30.0* 28.3*  PLT 552* 479*   BMET  Recent Labs  12/14/12 0450 12/16/12 0400  NA 132* 136  K 4.7 3.7  CL 101 104  CO2 25 26  GLUCOSE 111* 111*  BUN 31* 40*  CREATININE 0.74 0.76  CALCIUM 8.1* 7.8*   PT/INR No results found for this basename: LABPROT, INR,  in the last 72 hours ABG No results found for this basename: PHART, PCO2, PO2, HCO3,  in the last 72 hours  Studies/Results: Dg Chest Port 1 View  12/15/2012   CLINICAL DATA:  Aspiration.  Pseudomonas.  EXAM: PORTABLE CHEST - 1 VIEW  COMPARISON:  12/14/2012  FINDINGS: Feeding tube extends beyond the inferior aspect of the film. Left sided central line is unchanged with tip at high SVC.  Patient rotated left. Apical lordotic positioning. Cardiomegaly accentuated by AP portable technique. No right and no definite left pleural effusion. No pneumothorax. Low lung volumes. Right lung is clear. The inferior left hemi thorax is  poorly evaluated and partially excluded. Difficult to exclude developing left-sided airspace disease.  IMPRESSION: Suboptimal evaluation of the left hemi thorax. Difficult to exclude left-sided developing airspace disease. Consider short term radiographic followup.   Electronically Signed   By: Jeronimo Greaves M.D.   On: 12/15/2012 08:08    Anti-infectives: Anti-infectives   Start     Dose/Rate Route Frequency Ordered Stop   12/15/12 0800  piperacillin-tazobactam (ZOSYN) IVPB 3.375 g     3.375 g 12.5 mL/hr over 240 Minutes Intravenous 3 times per day 12/15/12 0722     12/08/12 1600  vancomycin (VANCOCIN) IVPB 750 mg/150 ml premix     750 mg 150 mL/hr over 60 Minutes Intravenous Every 8 hours 12/08/12 1318 12/10/12 0852   12/05/12 2000  vancomycin (VANCOCIN) IVPB 750 mg/150 ml premix  Status:  Discontinued     750 mg 150 mL/hr over 60 Minutes Intravenous Every 12 hours 12/05/12 1011 12/08/12 1318   12/05/12 1200  piperacillin-tazobactam (ZOSYN) IVPB 3.375 g  Status:  Discontinued     3.375 g 12.5 mL/hr over 240 Minutes Intravenous 3 times per day 12/05/12 0821 12/09/12 0850   12/05/12 0800  vancomycin (VANCOCIN) IVPB 1000 mg/200 mL premix  Status:  Discontinued     1,000 mg 200 mL/hr over 60 Minutes Intravenous Every 8 hours 12/05/12 0227 12/05/12 1011   12/05/12 0230  piperacillin-tazobactam (ZOSYN)  IVPB 3.375 g  Status:  Discontinued     3.375 g 12.5 mL/hr over 240 Minutes Intravenous 3 times per day 12/05/12 0218 12/05/12 0821   12/05/12 0230  vancomycin (VANCOCIN) IVPB 1000 mg/200 mL premix     1,000 mg 200 mL/hr over 60 Minutes Intravenous  Once 12/05/12 0227 12/05/12 0403   12/02/2012 1700  ceFAZolin (ANCEF) IVPB 2 g/50 mL premix     2 g 100 mL/hr over 30 Minutes Intravenous  Once 12/24/2012 1651 12/14/2012 1643   12/01/2012 1300  ciprofloxacin (CIPRO) tablet 500 mg  Status:  Discontinued     500 mg Oral 2 times daily 12/26/2012 1202 12/05/12 0229   12/15/2012 0000  ceFAZolin (ANCEF) IVPB 2  g/50 mL premix     2 g 100 mL/hr over 30 Minutes Intravenous  Once 12/02/12 1346 12/28/2012 0009   12/02/12 0200  ceFAZolin (ANCEF) IVPB 1 g/50 mL premix     1 g 100 mL/hr over 30 Minutes Intravenous Every 8 hours 12/31/2012 2134 12/02/12 1026   12/25/2012 1812  bacitracin 50,000 Units in sodium chloride irrigation 0.9 % 500 mL irrigation  Status:  Discontinued       As needed 12/20/2012 1828 12/30/2012 2000   12/07/2012 1530  ceFAZolin (ANCEF) 3 g in dextrose 5 % 50 mL IVPB     3 g 160 mL/hr over 30 Minutes Intravenous To Neuro OR-Station #32 12/21/2012 1521 12/23/2012 1757      Assessment/Plan: s/p Procedure(s): OPEN REDUCTION INTERNAL FIXATION (ORIF) TIBIAL PLATEAU- left Apparently the mother will get involved and decide what further action should take place.  Currently he will need a PEG for placement. WBC up to 23K.  Fevers up to 104.4.  Only cultures back are respiratory which ar being treated appropriately.  LOS: 15 days   Marta Lamas. Gae Bon, MD, FACS [phone] Trauma Surgeon 12/16/2012
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
## 2012-12-17 ENCOUNTER — Telehealth (HOSPITAL_COMMUNITY): Payer: Self-pay | Admitting: Emergency Medicine

## 2012-12-17 DIAGNOSIS — N179 Acute kidney failure, unspecified: Secondary | ICD-10-CM

## 2012-12-18 LAB — CULTURE, BLOOD (ROUTINE X 2): Culture: NO GROWTH
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
## 2012-12-24 ENCOUNTER — Telehealth (HOSPITAL_COMMUNITY): Payer: Self-pay | Admitting: Emergency Medicine

## 2012-12-26 NOTE — Telephone Encounter (Signed)
Patient's mother needed SSN to process death certificate. I provided.

## 2013-01-01 NOTE — Progress Notes (Signed)
Patient has expired.  This patient has been seen and I agree with the findings and treatment plan.  Marta Lamas. Gae Bon, MD, FACS [phone] (pager) [phone] (direct pager) Trauma Surgeon

## 2013-01-01 NOTE — Progress Notes (Signed)
Wasted 60mL of Morphine in the sink with Sol Blazing, RN. Vanice Sarah

## 2013-01-01 NOTE — Clinical Social Work Note (Signed)
Clinical Social Worker spoke with patient mother over the phone to offer support and provide information for [employer] representative regarding patient belongings.  Patient mother states that she is not interested in patient belongings and requested that belongings be given to charitable organization.  CSW to attempt to reach [place] with [employer] to relay message.  Clinical Social Worker will sign off for now as social work intervention is no longer needed. Please consult [place] again if new need arises.  [name], [place] [phone]

## 2013-01-01 NOTE — Progress Notes (Signed)
Patient ID: Johnny Mathis, male   DOB: 30-Sep-1960, 53 y.o.   MRN: [id number] I discussed with Dr Lindie Spruce.  Patient is a dnr, no code and palliative care only.  I wrote order to adjust that.

## 2013-01-01 NOTE — Progress Notes (Signed)
Medical examiner, [name], notified of pt. Death.  ME accepted the case. Johnny Mathis

## 2013-01-01 NOTE — Progress Notes (Signed)
Patient ID: Johnny Mathis, male   DOB: 11/20/1960, 53 y.o.   MRN: [id number]   LOS: 16 days   Subjective: Opens eyes to voice, no command following. Gulping, shallow respirations.   Objective: Vital signs in last 24 hours: Temp:  [96.7 F (35.9 C)-103.1 F (39.5 C)] 103.1 F (39.5 C) (12/17 0540) Pulse Rate:  [46-121] 121 (12/17 0540) Resp:  [15-46] 46 (12/17 0540) BP: (99-155)/(50-62) 155/62 mmHg (12/17 0540) SpO2:  [90 %-100 %] 90 % (12/17 0540) Last BM Date: 12/16/12   Physical Exam General appearance: mild distress Resp: rhonchi bilaterally Cardio: regular rate and rhythm GI: normal findings: bowel sounds normal and soft   Assessment/Plan: PHBC  SCI w/incomplete quadriplegia s/p ACDF   Left tib/fib fx s/p ORIF -- NWB  Facial abrasions/lacs -- Local care  ABL anemia -- Stable  Cardiomyopathy  FEN -- Asked RN to increase morphine, will d/c panda Dispo -- Comfort care only    Freeman Caldron, PA-C Pager: [phone] General Trauma PA Pager: [phone]   12-23-12
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
## 2013-01-01 NOTE — Progress Notes (Signed)
PT Cancellation Note and D/C  Patient Details Name: Shadoe Bethel MRN: [id number] DOB: 07-12-1960   Cancelled Treatment:    Reason Eval/Treat Not Completed: Pt with decline in medical status and comfort measures now.  PT discontinued per order.  Thanks.   INGOLD,Julyan Gales 12/30/2012, 9:49 AM  [facility] [phone] (pager)

## 2013-01-01 NOTE — Telephone Encounter (Signed)
I spoke with patient's mother and informed her he had expired.

## 2013-01-01 NOTE — Progress Notes (Signed)
[place] [facility], Erasmo Score, notified at 1015 am on 12/27/2012.  Pt. Referral number is [id number]. Vanice Sarah

## 2013-01-01 NOTE — Progress Notes (Signed)
Spoke with Pt.'s mother regarding patient belongings.  Mother stated they could be thrown away. Vanice Sarah

## 2013-01-01 NOTE — Telephone Encounter (Signed)
Mother called having difficulty getting in touch with [employer]. I provided number obtained with Google search.

## 2013-01-01 DEATH — deceased

## 2014-09-08 IMAGING — CR DG ABD PORTABLE 1V
1 series · 1 of 1 positions shown · non-contrast
Comparison: 12/05/2012

CLINICAL DATA: Feeding tube placement.

EXAM:
PORTABLE ABDOMEN - 1 VIEW

[AP]
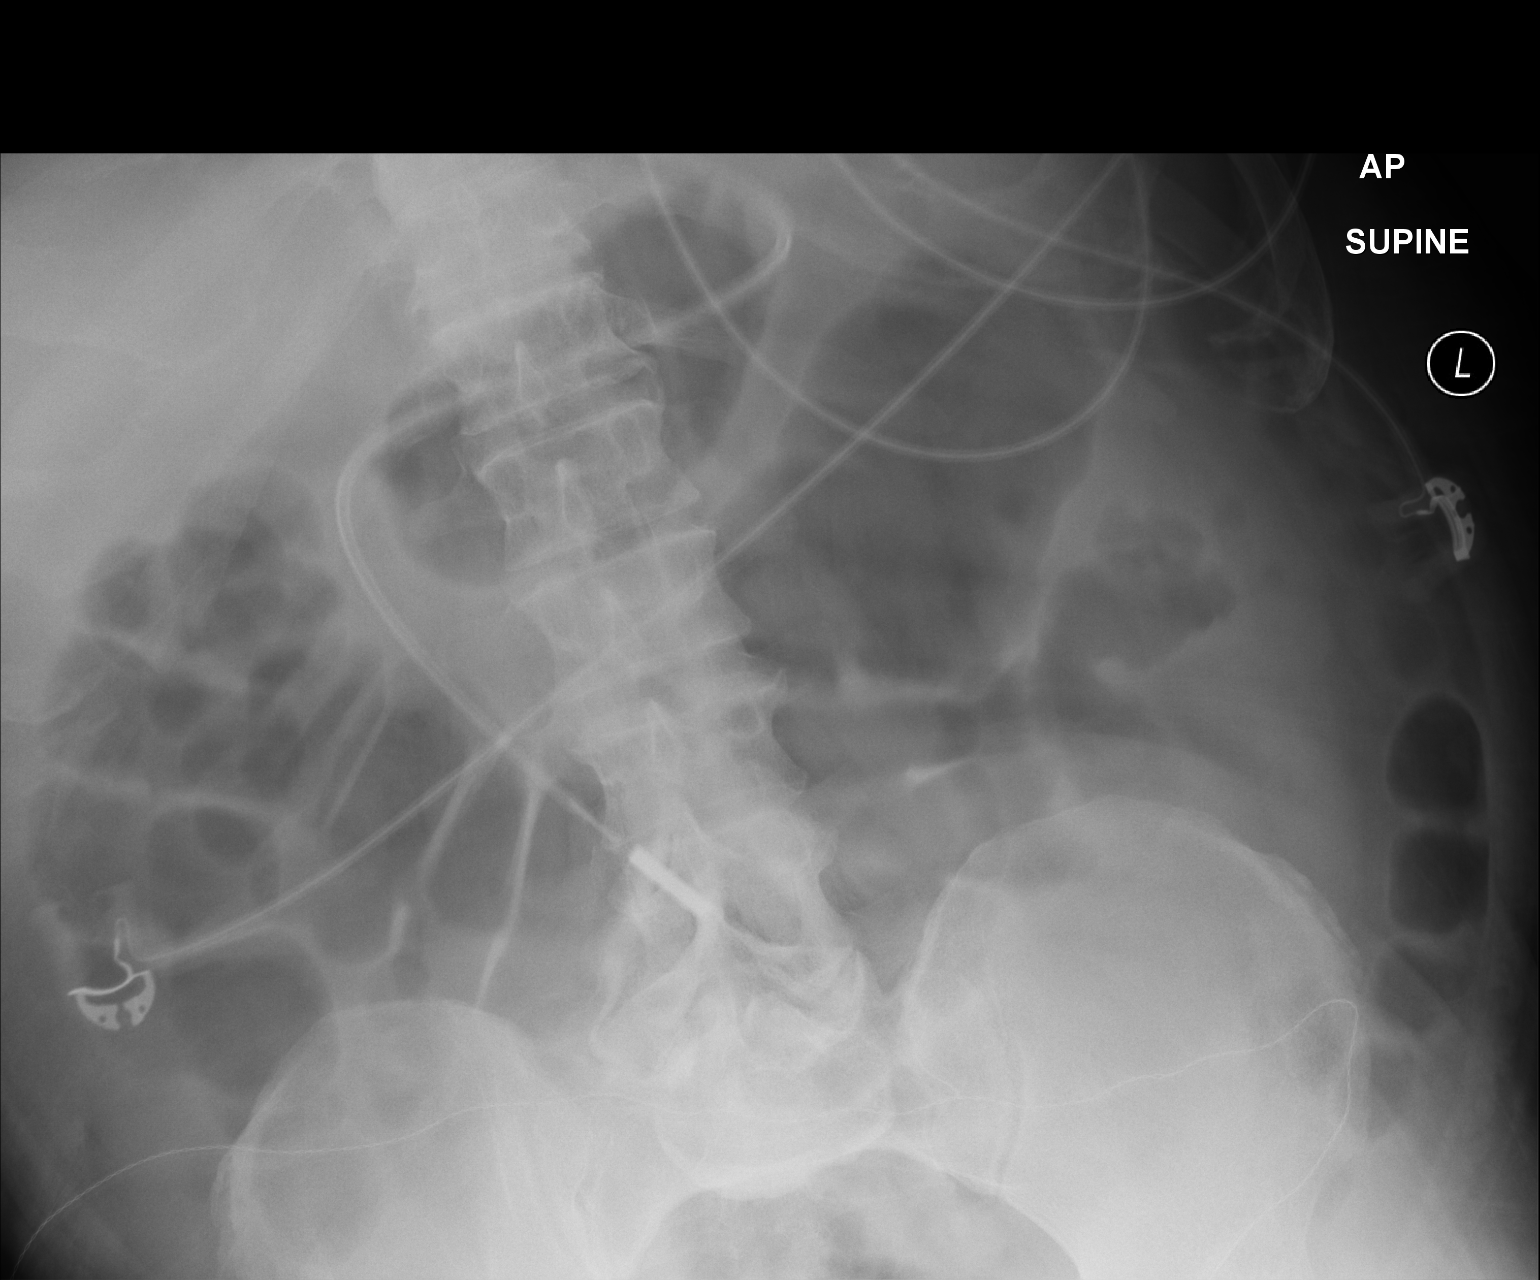

[1 of 1 positions shown; findings below may reference images not displayed]

FINDINGS: Ten a feeding tube is appreciated with tip projecting in expected
course of the duodenum. Air is seen within loops of large small
bowel. Visualized osseous structures unremarkable.
IMPRESSION: Feeding tube projecting in the expected course of the duodenum.
Nonspecific nonobstructive bowel gas pattern.

## 2014-09-09 IMAGING — CR DG CHEST 1V PORT
1 series · 1 of 1 positions shown · non-contrast
Comparison: 12/09/2012

CLINICAL DATA: Respiratory failure

EXAM:
PORTABLE CHEST - 1 VIEW

[AP]
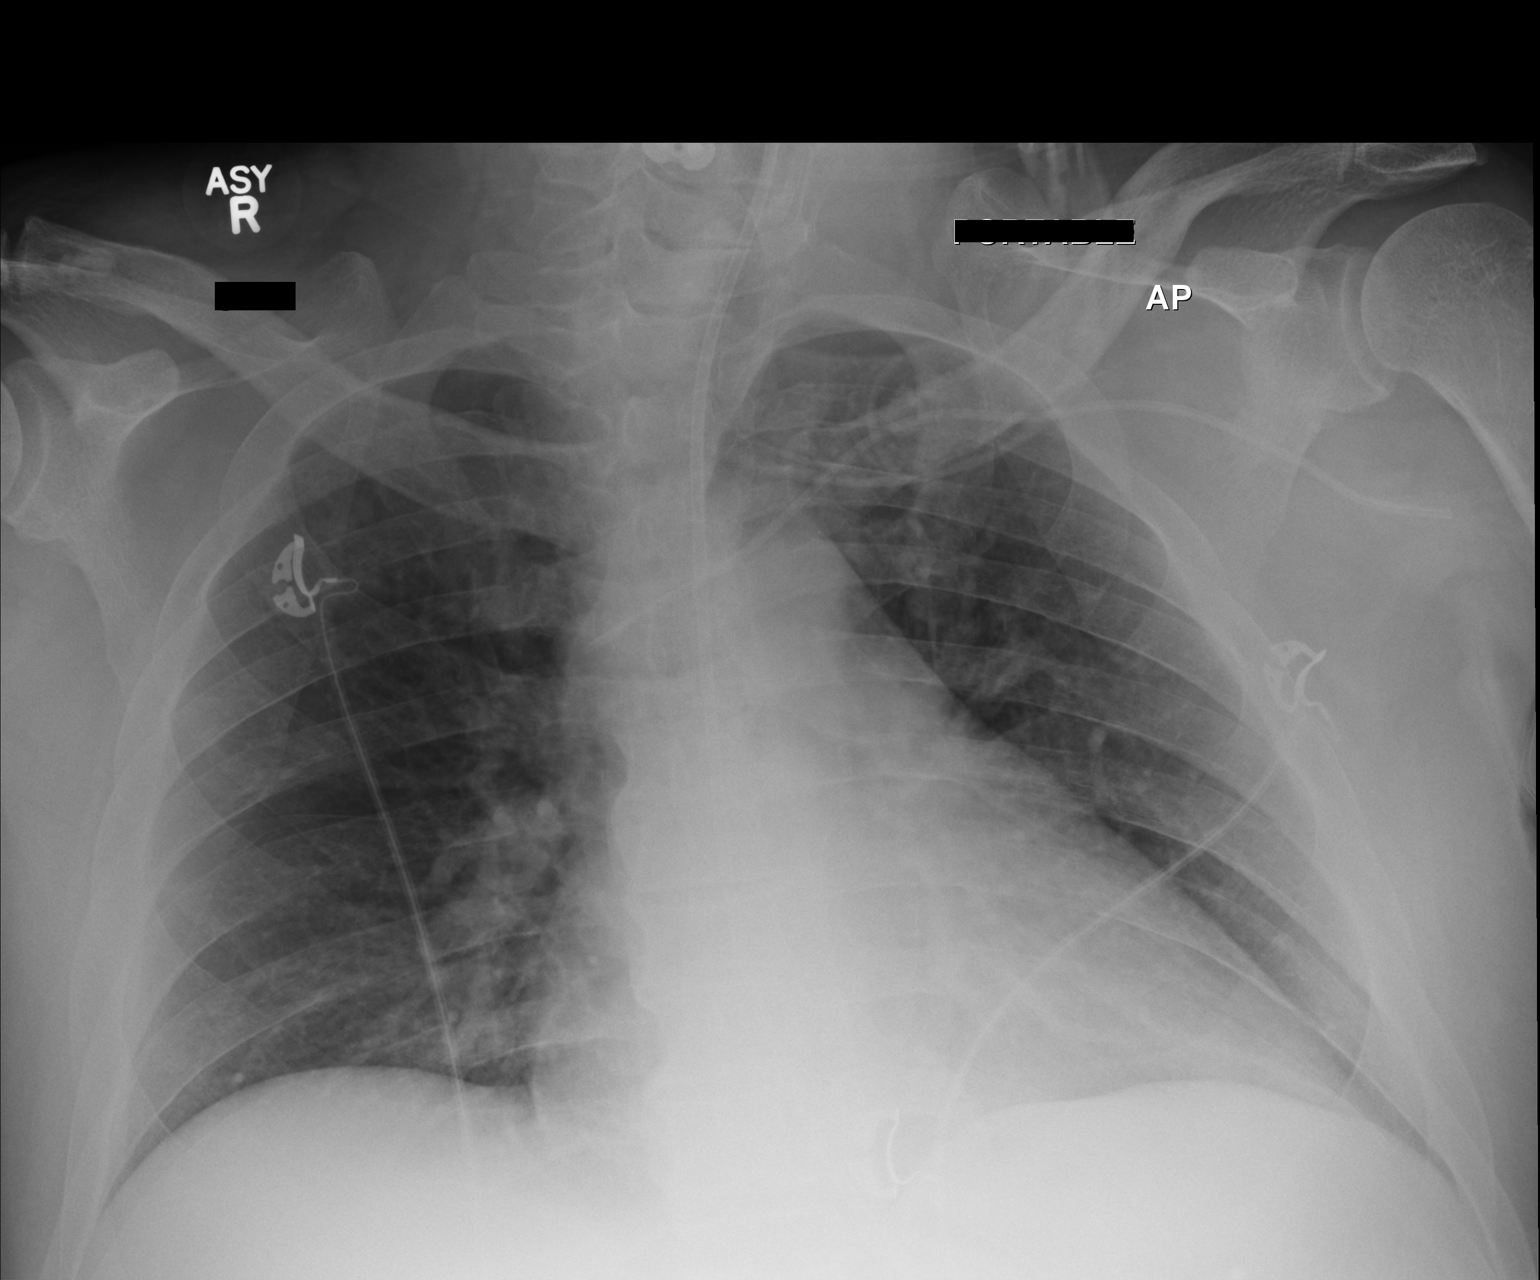

[1 of 1 positions shown; findings below may reference images not displayed]

FINDINGS: Left subclavian central line tip terminates at the brachiocephalic/
SVC junction. Heart size is mildly enlarged, unchanged, without
evidence for edema or focal parenchymal consolidation. No pleural
effusion. No pneumothorax. Feeding tube noted with tip below the
level of the hemidiaphragms but not included in the field of view.
Endotracheal tube has been removed. Cervical fusion hardware partly
visualized.
IMPRESSION: Hypoaeration with crowding of the lung volumes but no focal acute
finding.

## 2014-09-12 IMAGING — CR DG CHEST 1V PORT
1 series · 1 of 1 positions shown · non-contrast
Comparison: Portable examinations yesterday dating back to
12/08/2012.

CLINICAL DATA: Respiratory status change. Followup left basilar
atelectasis and/or pneumonia.

EXAM:
PORTABLE CHEST - 1 VIEW

[AP]
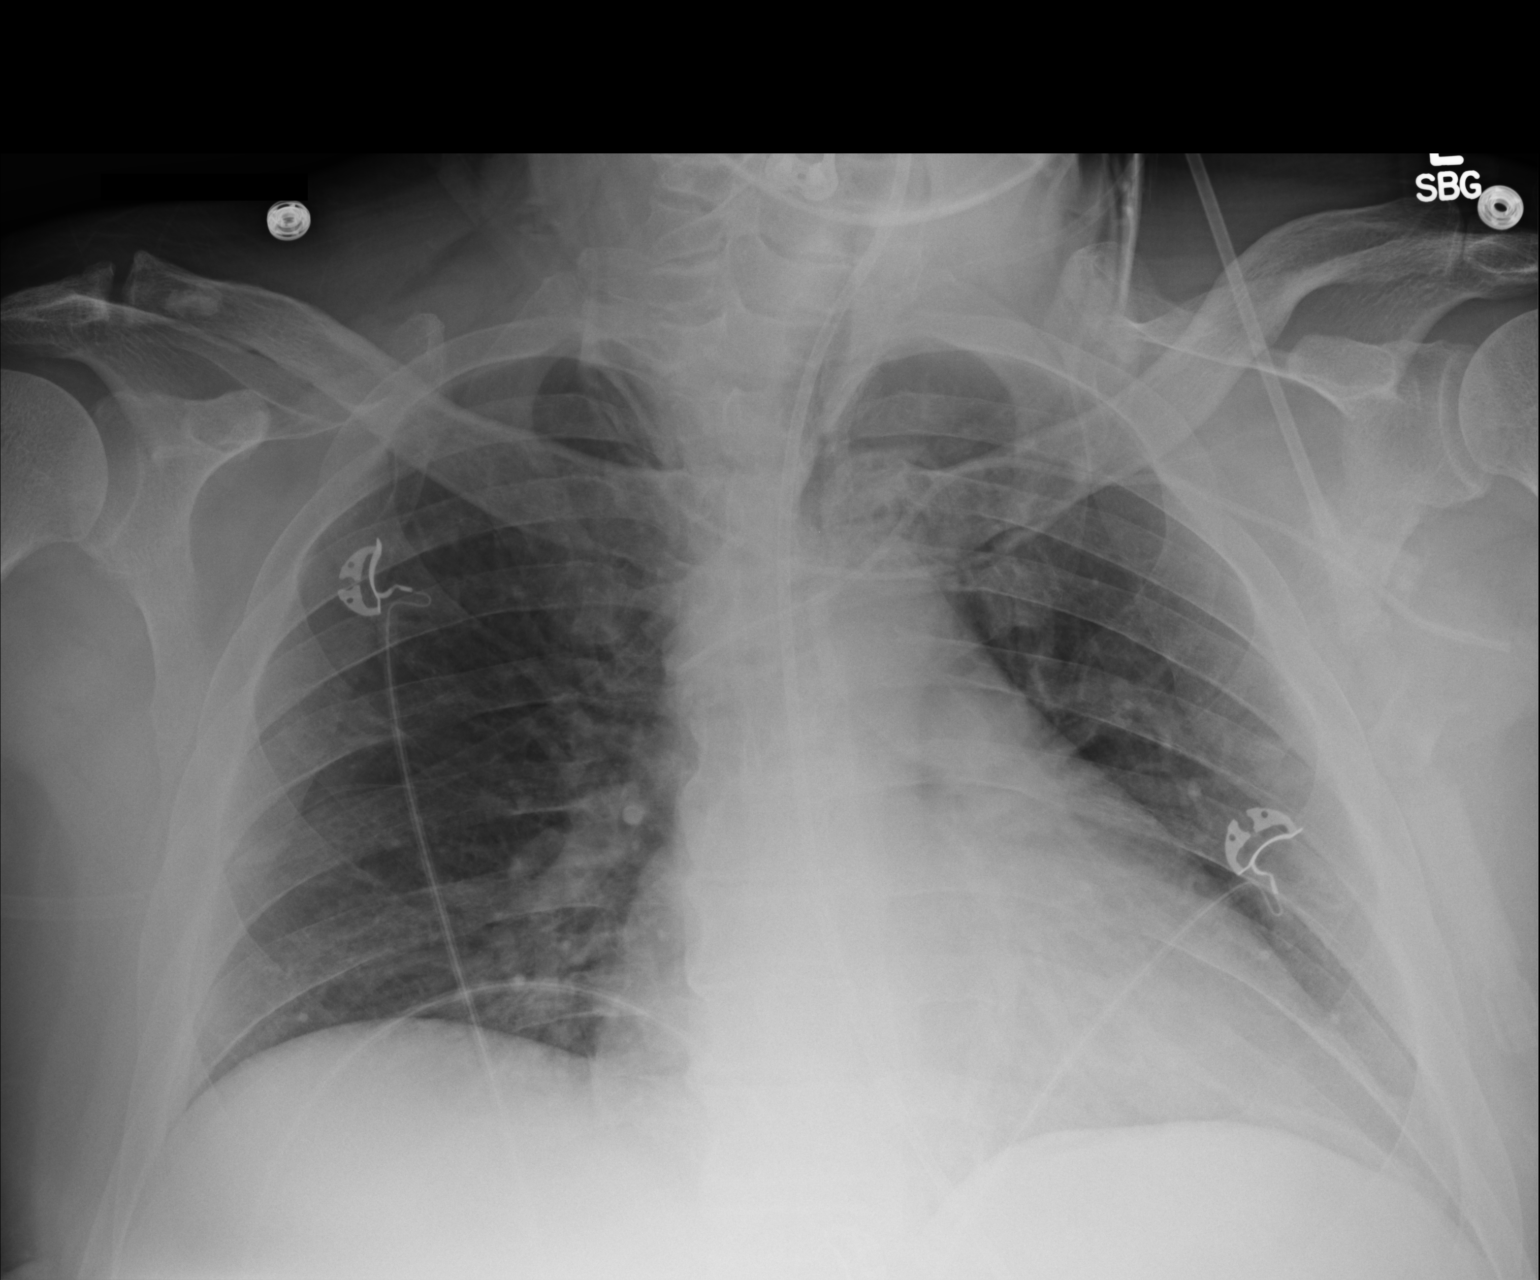

[1 of 1 positions shown; findings below may reference images not displayed]

FINDINGS: Cardiac silhouette moderately enlarged but stable. Pulmonary
vascularity normal. Interval resolution of the opacities at the left
lung base. Lungs now clear. Feeding tube courses below the diaphragm
though its tip is not included on the examination.
IMPRESSION: Resolution of left basilar atelectasis and/or pneumonia since
yesterday. No acute cardiopulmonary disease currently. Cardiomegaly
without pulmonary edema.

## 2014-09-14 IMAGING — CR DG CHEST 1V PORT
1 series · 1 of 1 positions shown · non-contrast
Comparison: 12/14/2012

CLINICAL DATA: Aspiration.  Pseudomonas.

EXAM:
PORTABLE CHEST - 1 VIEW

[AP]
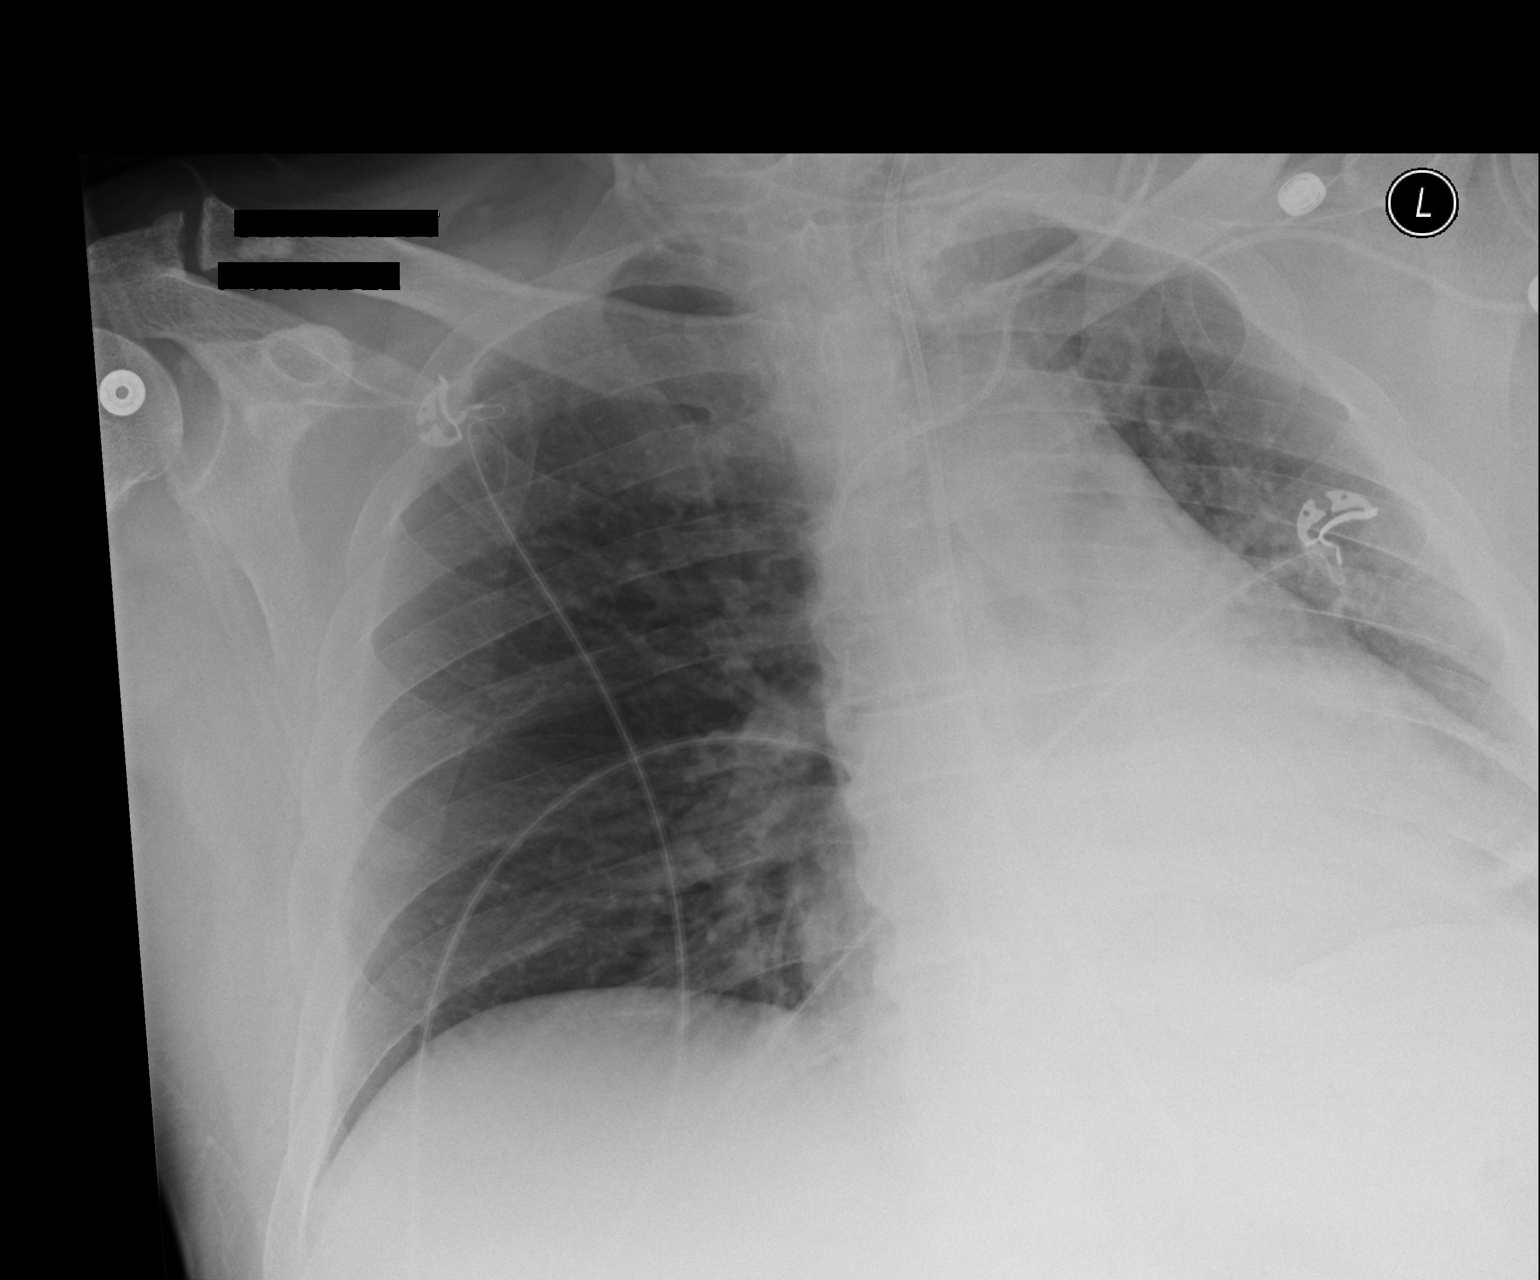

[1 of 1 positions shown; findings below may reference images not displayed]

FINDINGS: Feeding tube extends beyond the inferior aspect of the film. Left
sided central line is unchanged with tip at high SVC.

Patient rotated left. Apical lordotic positioning. Cardiomegaly
accentuated by AP portable technique. No right and no definite left
pleural effusion. No pneumothorax. Low lung volumes. Right lung is
clear. The inferior left hemi thorax is poorly evaluated and
partially excluded. Difficult to exclude developing left-sided
airspace disease.
IMPRESSION: Suboptimal evaluation of the left hemi thorax. Difficult to exclude
left-sided developing airspace disease. Consider short term
radiographic followup.
